# Patient Record
Sex: Female | Born: 1995 | Race: Black or African American | Hispanic: No | Marital: Married | Smoking: Never smoker
Health system: Southern US, Community
[De-identification: ages and names within clinical notes are randomized; demographics above are authoritative.]

## PROBLEM LIST (undated history)

## (undated) ENCOUNTER — Emergency Department (HOSPITAL_COMMUNITY): Admission: EM | Payer: Self-pay | Source: Home / Self Care

## (undated) ENCOUNTER — Inpatient Hospital Stay (HOSPITAL_COMMUNITY): Payer: Self-pay

## (undated) DIAGNOSIS — K219 Gastro-esophageal reflux disease without esophagitis: Secondary | ICD-10-CM

## (undated) DIAGNOSIS — F32A Depression, unspecified: Secondary | ICD-10-CM

## (undated) DIAGNOSIS — H539 Unspecified visual disturbance: Secondary | ICD-10-CM

## (undated) DIAGNOSIS — U071 COVID-19: Secondary | ICD-10-CM

## (undated) DIAGNOSIS — M26609 Unspecified temporomandibular joint disorder, unspecified side: Secondary | ICD-10-CM

## (undated) DIAGNOSIS — E669 Obesity, unspecified: Secondary | ICD-10-CM

## (undated) DIAGNOSIS — O139 Gestational [pregnancy-induced] hypertension without significant proteinuria, unspecified trimester: Secondary | ICD-10-CM

## (undated) DIAGNOSIS — F329 Major depressive disorder, single episode, unspecified: Secondary | ICD-10-CM

## (undated) DIAGNOSIS — F419 Anxiety disorder, unspecified: Secondary | ICD-10-CM

## (undated) DIAGNOSIS — D649 Anemia, unspecified: Secondary | ICD-10-CM

## (undated) DIAGNOSIS — B009 Herpesviral infection, unspecified: Secondary | ICD-10-CM

## (undated) HISTORY — DX: Herpesviral infection, unspecified: B00.9

## (undated) HISTORY — DX: Gestational (pregnancy-induced) hypertension without significant proteinuria, unspecified trimester: O13.9

## (undated) HISTORY — PX: PILONIDAL CYST EXCISION: SHX744

## (undated) HISTORY — DX: Major depressive disorder, single episode, unspecified: F32.9

## (undated) HISTORY — DX: Depression, unspecified: F32.A

## (undated) HISTORY — PX: PILONIDAL CYST / SINUS EXCISION: SUR543

---
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
## 2014-04-10 LAB — OB RESULTS CONSOLE ABO/RH: RH Type: POSITIVE

## 2014-04-10 LAB — OB RESULTS CONSOLE GC/CHLAMYDIA
Chlamydia: NEGATIVE
GC PROBE AMP, GENITAL: NEGATIVE

## 2014-04-10 LAB — OB RESULTS CONSOLE HEPATITIS B SURFACE ANTIGEN: Hepatitis B Surface Ag: NEGATIVE

## 2014-04-10 LAB — OB RESULTS CONSOLE RUBELLA ANTIBODY, IGM: RUBELLA: IMMUNE

## 2014-04-10 LAB — OB RESULTS CONSOLE ANTIBODY SCREEN: ANTIBODY SCREEN: NEGATIVE

## 2014-04-10 LAB — OB RESULTS CONSOLE HIV ANTIBODY (ROUTINE TESTING): HIV: NONREACTIVE

## 2014-04-10 LAB — OB RESULTS CONSOLE RPR: RPR: NONREACTIVE

## 2014-10-03 LAB — OB RESULTS CONSOLE GBS: STREP GROUP B AG: NEGATIVE

## 2014-10-06 ENCOUNTER — Encounter (HOSPITAL_COMMUNITY): Payer: Self-pay | Admitting: Radiology

## 2014-10-06 ENCOUNTER — Inpatient Hospital Stay (HOSPITAL_COMMUNITY): Payer: Medicaid Other

## 2014-10-06 ENCOUNTER — Inpatient Hospital Stay (HOSPITAL_COMMUNITY)
Admission: AD | Admit: 2014-10-06 | Discharge: 2014-10-06 | Disposition: A | Discharge status: Home / Self Care | Payer: Medicaid Other | Source: Ambulatory Visit | Attending: Obstetrics & Gynecology | Admitting: Obstetrics & Gynecology

## 2014-10-06 DIAGNOSIS — O4693 Antepartum hemorrhage, unspecified, third trimester: Secondary | ICD-10-CM

## 2014-10-06 DIAGNOSIS — O368190 Decreased fetal movements, unspecified trimester, not applicable or unspecified: Secondary | ICD-10-CM

## 2014-10-06 DIAGNOSIS — R030 Elevated blood-pressure reading, without diagnosis of hypertension: Secondary | ICD-10-CM | POA: Diagnosis not present

## 2014-10-06 DIAGNOSIS — Z3A37 37 weeks gestation of pregnancy: Secondary | ICD-10-CM | POA: Insufficient documentation

## 2014-10-06 DIAGNOSIS — O26853 Spotting complicating pregnancy, third trimester: Secondary | ICD-10-CM | POA: Diagnosis not present

## 2014-10-06 DIAGNOSIS — O368130 Decreased fetal movements, third trimester, not applicable or unspecified: Secondary | ICD-10-CM | POA: Insufficient documentation

## 2014-10-06 DIAGNOSIS — O359XX0 Maternal care for (suspected) fetal abnormality and damage, unspecified, not applicable or unspecified: Secondary | ICD-10-CM

## 2014-10-06 DIAGNOSIS — IMO0001 Reserved for inherently not codable concepts without codable children: Secondary | ICD-10-CM

## 2014-10-06 LAB — CBC
HCT: 31.5 % — ABNORMAL LOW (ref 36.0–46.0)
Hemoglobin: 10.3 g/dL — ABNORMAL LOW (ref 12.0–15.0)
MCH: 28.2 pg (ref 26.0–34.0)
MCHC: 32.7 g/dL (ref 30.0–36.0)
MCV: 86.3 fL (ref 78.0–100.0)
Platelets: 169 10*3/uL (ref 150–400)
RBC: 3.65 MIL/uL — ABNORMAL LOW (ref 3.87–5.11)
RDW: 13.4 % (ref 11.5–15.5)
WBC: 8.6 10*3/uL (ref 4.0–10.5)

## 2014-10-06 LAB — COMPREHENSIVE METABOLIC PANEL
ALT: 13 U/L (ref 0–35)
AST: 22 U/L (ref 0–37)
Albumin: 2.9 g/dL — ABNORMAL LOW (ref 3.5–5.2)
Alkaline Phosphatase: 136 U/L — ABNORMAL HIGH (ref 39–117)
Anion gap: 12 (ref 5–15)
BUN: 7 mg/dL (ref 6–23)
CO2: 21 mEq/L (ref 19–32)
Calcium: 9.2 mg/dL (ref 8.4–10.5)
Chloride: 102 mEq/L (ref 96–112)
Creatinine, Ser: 0.64 mg/dL (ref 0.50–1.10)
GFR calc Af Amer: >90 mL/min (ref >90)
GFR calc non Af Amer: >90 mL/min (ref >90)
Glucose, Bld: 83 mg/dL (ref 70–99)
Potassium: 3.6 mEq/L — ABNORMAL LOW (ref 3.7–5.3)
Sodium: 135 mEq/L — ABNORMAL LOW (ref 137–147)
Total Bilirubin: 0.5 mg/dL (ref 0.3–1.2)
Total Protein: 7.3 g/dL (ref 6.0–8.3)

## 2014-10-06 LAB — PROTEIN / CREATININE RATIO, URINE
Creatinine, Urine: 80.19 mg/dL
Protein Creatinine Ratio: 0.07 (ref 0.00–0.15)
Total Protein, Urine: 5.4 mg/dL

## 2014-10-06 LAB — URIC ACID: Uric Acid, Serum: 4.4 mg/dL (ref 2.4–7.0)

## 2014-10-06 NOTE — MAU Note (Signed)
Pt. Sent over from doctors office due to increased blood pressures. Here for evaluation. Baby is moving well. Irregular contractions. Did have bright red spotting and was seen at doctor for that today. States she was 1.5cm there. Pt. States she has a headache but has not taken any tylenol for it.

## 2014-10-06 NOTE — Discharge Instructions (Signed)
Maternal fetal medicine is reviewing images from ultrasound - unable to give report tonight.  May need additional images - MFM office will notify [facility] and patient in AM of results.

## 2014-10-06 NOTE — MAU Provider Note (Signed)
  History     CSN: [id number]  Arrival date and time: 10/06/14 1449 No call from nurse on patient  First Provider Initiated Contact with Patient 10/06/14 1720      No chief complaint on file.  HPI  sent from office for elevated BP and vaginal spotting x 24 hours with decreased FM + FM today No headache / epigastric pain  Past Medical History  Diagnosis Date  . Depression   . Anxiety   . Vision abnormalities     Pt wears contacts  . TMJ (temporomandibular joint disorder)   . Anemia   . Obesity   . GERD (gastroesophageal reflux disease)     Past Surgical History  Procedure Laterality Date  . Wisdom tooth extraction  2013    No family history on file.  History  Substance Use Topics  . Smoking status: Never Smoker   . Smokeless tobacco: Not on file  . Alcohol Use: Yes     Comment: Socially/wine or beer maybe once per month    Allergies: No Known Allergies  Prescriptions prior to admission  Medication Sig Dispense Refill  . escitalopram (LEXAPRO) 10 MG tablet Take 1 tablet (10 mg total) by mouth daily after breakfast.  30 tablet  1  . loratadine (CLARITIN) 10 MG tablet Take 1 tablet (10 mg total) by mouth daily.      . medroxyPROGESTERone (DEPO-PROVERA) 150 MG/ML injection Inject 1 mL (150 mg total) into the muscle every 3 (three) months. Next dose due 09/20/2012.  1 mL    . Multiple Vitamin (MULTIVITAMIN WITH MINERALS) TABS Take 1 tablet by mouth daily.       ROS FM today - feels overall decreased for past couple of days Some ctx and cramping Red spotting x 24 hours  Physical Exam   Blood pressure 127/75, pulse 96, temperature 98.4 F (36.9 C), temperature source Oral, resp. rate 16.  Physical Exam Alert and oriented / NAD or pain Abdomen soft and non-tender Uterus gravid and non-tender Defer pelvic - done in office with cervical dilation at 2cm / 70% / vtx 0 station / intact membranes  MAU Course  Procedures  NST - reactive SONO- pending  report  Assessment and Plan  38 weeks with red spotting  In past 24 hours Elevated BP in office today Decreased FMs  1) Serial BP - stable BP 2) PIH - normal today 3) SONO - unable to obtain formal report tonight per sono dept - images sent to MFM for review.                     MFM call to Dr Seymour BarsLavoie - will review images & notify us in AM with results                      may request patient to come to MFM for additional images  DC home - labor precautions to call / additionally PIH signs and any red bleeding like menses OV at [facility] on Friday as scheduled  Marlinda MikeBAILEY, TANYA 10/06/2014, 5:29 PM

## 2014-10-07 ENCOUNTER — Encounter (HOSPITAL_COMMUNITY): Payer: Self-pay

## 2014-10-07 ENCOUNTER — Ambulatory Visit (HOSPITAL_COMMUNITY)
Admission: RE | Admit: 2014-10-07 | Discharge: 2014-10-07 | Disposition: A | Discharge status: Home / Self Care | Payer: Medicaid Other | Source: Ambulatory Visit | Attending: Obstetrics & Gynecology | Admitting: Obstetrics & Gynecology

## 2014-10-07 ENCOUNTER — Other Ambulatory Visit (HOSPITAL_COMMUNITY): Payer: Self-pay | Admitting: Obstetrics and Gynecology

## 2014-10-07 VITALS — BP 120/75 | HR 95 | Wt 244.0 lb

## 2014-10-07 DIAGNOSIS — IMO0001 Reserved for inherently not codable concepts without codable children: Secondary | ICD-10-CM

## 2014-10-07 DIAGNOSIS — O3663X0 Maternal care for excessive fetal growth, third trimester, not applicable or unspecified: Secondary | ICD-10-CM | POA: Diagnosis not present

## 2014-10-07 DIAGNOSIS — O3663X1 Maternal care for excessive fetal growth, third trimester, fetus 1: Secondary | ICD-10-CM

## 2014-10-07 DIAGNOSIS — Z3A37 37 weeks gestation of pregnancy: Secondary | ICD-10-CM | POA: Diagnosis not present

## 2014-10-07 DIAGNOSIS — Q893 Situs inversus: Secondary | ICD-10-CM

## 2014-10-07 DIAGNOSIS — O359XX0 Maternal care for (suspected) fetal abnormality and damage, unspecified, not applicable or unspecified: Secondary | ICD-10-CM

## 2014-10-07 DIAGNOSIS — O358XX0 Maternal care for other (suspected) fetal abnormality and damage, not applicable or unspecified: Secondary | ICD-10-CM | POA: Insufficient documentation

## 2014-10-13 ENCOUNTER — Inpatient Hospital Stay (HOSPITAL_COMMUNITY)
Admission: AD | Admit: 2014-10-13 | Discharge: 2014-10-13 | Disposition: A | Discharge status: Home / Self Care | Payer: Medicaid Other | Source: Ambulatory Visit | Attending: Obstetrics and Gynecology | Admitting: Obstetrics and Gynecology

## 2014-10-13 ENCOUNTER — Encounter (HOSPITAL_COMMUNITY): Payer: Self-pay | Admitting: *Deleted

## 2014-10-13 DIAGNOSIS — O9989 Other specified diseases and conditions complicating pregnancy, childbirth and the puerperium: Secondary | ICD-10-CM | POA: Insufficient documentation

## 2014-10-13 DIAGNOSIS — O133 Gestational [pregnancy-induced] hypertension without significant proteinuria, third trimester: Secondary | ICD-10-CM

## 2014-10-13 DIAGNOSIS — R030 Elevated blood-pressure reading, without diagnosis of hypertension: Secondary | ICD-10-CM | POA: Diagnosis not present

## 2014-10-13 DIAGNOSIS — Z3A38 38 weeks gestation of pregnancy: Secondary | ICD-10-CM | POA: Diagnosis not present

## 2014-10-13 LAB — URINALYSIS, ROUTINE W REFLEX MICROSCOPIC
Bilirubin Urine: NEGATIVE
Glucose, UA: NEGATIVE mg/dL
Ketones, ur: NEGATIVE mg/dL
Nitrite: NEGATIVE
PH: 6.5 (ref 5.0–8.0)
Protein, ur: NEGATIVE mg/dL
SPECIFIC GRAVITY, URINE: 1.01 (ref 1.005–1.030)
Urobilinogen, UA: 1 mg/dL (ref 0.0–1.0)

## 2014-10-13 LAB — CBC
HCT: 30.7 % — ABNORMAL LOW (ref 36.0–46.0)
Hemoglobin: 9.9 g/dL — ABNORMAL LOW (ref 12.0–15.0)
MCH: 28 pg (ref 26.0–34.0)
MCHC: 32.2 g/dL (ref 30.0–36.0)
MCV: 86.7 fL (ref 78.0–100.0)
Platelets: 172 10*3/uL (ref 150–400)
RBC: 3.54 MIL/uL — ABNORMAL LOW (ref 3.87–5.11)
RDW: 13.8 % (ref 11.5–15.5)
WBC: 8.5 10*3/uL (ref 4.0–10.5)

## 2014-10-13 LAB — COMPREHENSIVE METABOLIC PANEL
ALT: 10 U/L (ref 0–35)
AST: 18 U/L (ref 0–37)
Albumin: 2.7 g/dL — ABNORMAL LOW (ref 3.5–5.2)
Alkaline Phosphatase: 140 U/L — ABNORMAL HIGH (ref 39–117)
Anion gap: 12 (ref 5–15)
BUN: 8 mg/dL (ref 6–23)
CO2: 22 mEq/L (ref 19–32)
Calcium: 8.6 mg/dL (ref 8.4–10.5)
Chloride: 101 mEq/L (ref 96–112)
Creatinine, Ser: 0.71 mg/dL (ref 0.50–1.10)
GFR calc Af Amer: >90 mL/min (ref >90)
GFR calc non Af Amer: >90 mL/min (ref >90)
Glucose, Bld: 81 mg/dL (ref 70–99)
Potassium: 3.7 mEq/L (ref 3.7–5.3)
Sodium: 135 mEq/L — ABNORMAL LOW (ref 137–147)
Total Bilirubin: 0.5 mg/dL (ref 0.3–1.2)
Total Protein: 6.7 g/dL (ref 6.0–8.3)

## 2014-10-13 LAB — URIC ACID: Uric Acid, Serum: 4.6 mg/dL (ref 2.4–7.0)

## 2014-10-13 LAB — URINE MICROSCOPIC-ADD ON

## 2014-10-13 LAB — PROTEIN / CREATININE RATIO, URINE
Creatinine, Urine: 103.2 mg/dL
Protein Creatinine Ratio: 0.08 (ref 0.00–0.15)
Total Protein, Urine: 8.3 mg/dL

## 2014-10-13 NOTE — MAU Note (Addendum)
Pt presents to MAU from physicians office for NST, increase in blood pressure, and labs.

## 2014-10-13 NOTE — MAU Provider Note (Signed)
  History     CSN: [id number]  Arrival date and time: 10/13/14 1601     Chief Complaint  Patient presents with  . Non-stress Test  . Hypertension   HPI  [facility]  Past Medical History  Diagnosis Date  . Depression   . Anxiety   . Vision abnormalities     Pt wears contacts  . TMJ (temporomandibular joint disorder)   . Anemia   . Obesity   . GERD (gastroesophageal reflux disease)     Past Surgical History  Procedure Laterality Date  . Wisdom tooth extraction  2013    History reviewed. No pertinent family history.  History  Substance Use Topics  . Smoking status: Never Smoker   . Smokeless tobacco: Not on file  . Alcohol Use: Yes     Comment: Socially/wine or beer maybe once per month    Allergies: No Known Allergies  Prescriptions prior to admission  Medication Sig Dispense Refill Last Dose  . acetaminophen (TYLENOL) 500 MG tablet Take 500 mg by mouth every 6 (six) hours as needed.   Past Month at Unknown time  . diphenhydrAMINE (SOMINEX) 25 MG tablet Take 25-50 mg by mouth at bedtime as needed for itching or sleep.   Past Week at Unknown time  . Prenatal Vit-Fe Fumarate-FA (PRENATAL MULTIVITAMIN) TABS tablet Take 1 tablet by mouth daily at 12 noon.   10/13/2014 at 0900    ROS Physical Exam   Blood pressure 138/86, pulse 100, temperature 98.8 F (37.1 C), resp. rate 18, height 5\' 8"  (1.727 m), weight 112.492 kg (248 lb).  Physical Exam  Alert and oriented Abdomen soft and non-tender Defer VE  NST reactive - no decels / no ctx  MAU Course  Procedures  Assessment and Plan  PIH labs stable - no evidence of PEC NST reactive  Continue expectant management - monitor BP at home and call BP > 160/100 Call with any PIH signs Planned IOL 11/10  Marlinda MikeBAILEY, Ciaran Begay 10/13/2014, 6:19 PM

## 2014-10-13 NOTE — Progress Notes (Signed)
Wiliam Ke Bailey CNM notified of lab results, states she will come and evaluate pt

## 2014-10-15 ENCOUNTER — Telehealth (HOSPITAL_COMMUNITY): Payer: Self-pay | Admitting: *Deleted

## 2014-10-15 ENCOUNTER — Encounter (HOSPITAL_COMMUNITY): Payer: Self-pay | Admitting: *Deleted

## 2014-10-15 NOTE — Telephone Encounter (Signed)
Preadmission screen  

## 2014-10-21 ENCOUNTER — Encounter (HOSPITAL_COMMUNITY): Payer: Self-pay

## 2014-10-21 ENCOUNTER — Inpatient Hospital Stay (HOSPITAL_COMMUNITY)
Admission: RE | Admit: 2014-10-21 | Discharge: 2014-10-24 | DRG: 775 | Disposition: A | Discharge status: Home / Self Care | Payer: Medicaid Other | Source: Ambulatory Visit | Attending: Obstetrics and Gynecology | Admitting: Obstetrics and Gynecology

## 2014-10-21 DIAGNOSIS — O133 Gestational [pregnancy-induced] hypertension without significant proteinuria, third trimester: Principal | ICD-10-CM | POA: Diagnosis present

## 2014-10-21 DIAGNOSIS — O99214 Obesity complicating childbirth: Secondary | ICD-10-CM | POA: Diagnosis present

## 2014-10-21 DIAGNOSIS — O99344 Other mental disorders complicating childbirth: Secondary | ICD-10-CM | POA: Diagnosis present

## 2014-10-21 DIAGNOSIS — D509 Iron deficiency anemia, unspecified: Secondary | ICD-10-CM | POA: Diagnosis present

## 2014-10-21 DIAGNOSIS — O9902 Anemia complicating childbirth: Secondary | ICD-10-CM | POA: Diagnosis present

## 2014-10-21 DIAGNOSIS — O99324 Drug use complicating childbirth: Secondary | ICD-10-CM | POA: Diagnosis present

## 2014-10-21 DIAGNOSIS — F1290 Cannabis use, unspecified, uncomplicated: Secondary | ICD-10-CM | POA: Diagnosis present

## 2014-10-21 DIAGNOSIS — K219 Gastro-esophageal reflux disease without esophagitis: Secondary | ICD-10-CM | POA: Diagnosis present

## 2014-10-21 DIAGNOSIS — Z3A39 39 weeks gestation of pregnancy: Secondary | ICD-10-CM | POA: Diagnosis present

## 2014-10-21 DIAGNOSIS — O139 Gestational [pregnancy-induced] hypertension without significant proteinuria, unspecified trimester: Secondary | ICD-10-CM | POA: Diagnosis present

## 2014-10-21 DIAGNOSIS — F329 Major depressive disorder, single episode, unspecified: Secondary | ICD-10-CM | POA: Diagnosis present

## 2014-10-21 DIAGNOSIS — F419 Anxiety disorder, unspecified: Secondary | ICD-10-CM | POA: Diagnosis present

## 2014-10-21 DIAGNOSIS — D62 Acute posthemorrhagic anemia: Secondary | ICD-10-CM | POA: Diagnosis not present

## 2014-10-21 DIAGNOSIS — E669 Obesity, unspecified: Secondary | ICD-10-CM | POA: Diagnosis present

## 2014-10-21 DIAGNOSIS — O9962 Diseases of the digestive system complicating childbirth: Secondary | ICD-10-CM | POA: Diagnosis present

## 2014-10-21 LAB — COMPREHENSIVE METABOLIC PANEL
ALT: 12 U/L (ref 0–35)
AST: 19 U/L (ref 0–37)
Albumin: 2.7 g/dL — ABNORMAL LOW (ref 3.5–5.2)
Alkaline Phosphatase: 155 U/L — ABNORMAL HIGH (ref 39–117)
Anion gap: 15 (ref 5–15)
BUN: 7 mg/dL (ref 6–23)
CALCIUM: 8.5 mg/dL (ref 8.4–10.5)
CO2: 19 mEq/L (ref 19–32)
Chloride: 102 mEq/L (ref 96–112)
Creatinine, Ser: 0.59 mg/dL (ref 0.50–1.10)
GFR calc Af Amer: >90 mL/min (ref >90)
GFR calc non Af Amer: >90 mL/min (ref >90)
Glucose, Bld: 113 mg/dL — ABNORMAL HIGH (ref 70–99)
Potassium: 3.4 mEq/L — ABNORMAL LOW (ref 3.7–5.3)
Sodium: 136 mEq/L — ABNORMAL LOW (ref 137–147)
TOTAL PROTEIN: 6.3 g/dL (ref 6.0–8.3)
Total Bilirubin: 0.4 mg/dL (ref 0.3–1.2)

## 2014-10-21 LAB — CBC
HCT: 31.4 % — ABNORMAL LOW (ref 36.0–46.0)
Hemoglobin: 10 g/dL — ABNORMAL LOW (ref 12.0–15.0)
MCH: 27.7 pg (ref 26.0–34.0)
MCHC: 31.8 g/dL (ref 30.0–36.0)
MCV: 87 fL (ref 78.0–100.0)
Platelets: 166 10*3/uL (ref 150–400)
RBC: 3.61 MIL/uL — ABNORMAL LOW (ref 3.87–5.11)
RDW: 14.3 % (ref 11.5–15.5)
WBC: 10 10*3/uL (ref 4.0–10.5)

## 2014-10-21 LAB — URIC ACID: Uric Acid, Serum: 4.4 mg/dL (ref 2.4–7.0)

## 2014-10-21 MED ORDER — ONDANSETRON HCL 4 MG/2ML IJ SOLN: 4.0000 mg | Freq: Four times a day (QID) | INTRAMUSCULAR | Status: DC | PRN

## 2014-10-21 MED ORDER — OXYTOCIN 40 UNITS IN LACTATED RINGERS INFUSION - SIMPLE MED
62.5000 mL/h | INTRAVENOUS | Status: DC
  Filled 2014-10-21: qty 1000

## 2014-10-21 MED ORDER — OXYTOCIN 10 UNIT/ML IJ SOLN: 10.0000 [IU] | Freq: Once | INTRAMUSCULAR | Status: DC

## 2014-10-21 MED ORDER — OXYTOCIN BOLUS FROM INFUSION
500.0000 mL | INTRAVENOUS | Status: DC
  Administered 2014-10-22: 500 mL via INTRAVENOUS

## 2014-10-21 MED ORDER — MISOPROSTOL 25 MCG QUARTER TABLET
25.0000 ug | ORAL_TABLET | ORAL | Status: DC | PRN
  Administered 2014-10-21 – 2014-10-22 (×3): 25 ug via VAGINAL
  Filled 2014-10-21 (×3): qty 0.25

## 2014-10-21 MED ORDER — OXYCODONE-ACETAMINOPHEN 5-325 MG PO TABS: 1.0000 | ORAL_TABLET | ORAL | Status: DC | PRN

## 2014-10-21 MED ORDER — ZOLPIDEM TARTRATE 5 MG PO TABS
5.0000 mg | ORAL_TABLET | Freq: Every day | ORAL | Status: DC
  Administered 2014-10-22: 5 mg via ORAL
  Filled 2014-10-21: qty 1

## 2014-10-21 MED ORDER — CITRIC ACID-SODIUM CITRATE 334-500 MG/5ML PO SOLN: 30.0000 mL | ORAL | Status: DC | PRN

## 2014-10-21 MED ORDER — LACTATED RINGERS IV SOLN
500.0000 mL | INTRAVENOUS | Status: DC | PRN
  Administered 2014-10-21 – 2014-10-22 (×2): 1000 mL via INTRAVENOUS

## 2014-10-21 MED ORDER — OXYCODONE-ACETAMINOPHEN 5-325 MG PO TABS: 2.0000 | ORAL_TABLET | ORAL | Status: DC | PRN

## 2014-10-21 MED ORDER — LIDOCAINE HCL (PF) 1 % IJ SOLN
30.0000 mL | INTRAMUSCULAR | Status: DC | PRN
  Filled 2014-10-21: qty 30

## 2014-10-21 MED ORDER — TERBUTALINE SULFATE 1 MG/ML IJ SOLN: 0.2500 mg | Freq: Once | INTRAMUSCULAR | Status: AC | PRN

## 2014-10-21 NOTE — H&P (Signed)
  OB ADMISSION/ HISTORY & PHYSICAL:  Admission Date: 10/21/2014  6:40 PM  Admit Diagnosis: 39.[redacted] weeks gestation, Gestational HTN  Stacie Simpson is a 18 y.o. female presenting for IOL for Gestational HTN.  Prenatal History: G1P0   EDC:10/26/2014, by Ultrasound   [facility]  Primary Ob Provider: Marlinda Mikeanya Bailey, CNM Prenatal course complicated by gestational HTN-neg PIH labs 1 wk ago, normal fetal surveillance; rt fetal pyelectasis-resolved by 37 wks; obesity; IDA  Prenatal Labs: ABO, Rh: O (04/30 0000)  Antibody: Negative (04/30 0000) Rubella: Immune (04/30 0000)  RPR: Nonreactive (04/30 0000)  HBsAg: Negative (04/30 0000)  HIV: Non-reactive (04/30 0000)  GBS: Negative (10/23 0000)  1 hr GTT : 105  Medical / Surgical History :  Past medical history:  Past Medical History  Diagnosis Date  . Depression   . Anxiety   . Vision abnormalities     Pt wears contacts  . TMJ (temporomandibular joint disorder)   . Anemia   . Obesity   . GERD (gastroesophageal reflux disease)   . Pregnancy induced hypertension      Past surgical history:  Past Surgical History  Procedure Laterality Date  . Wisdom tooth extraction  2013    Family History:  Family History  Problem Relation Age of Onset  . Hypertension Father   . Hypertension Maternal Grandmother   . Hypertension Maternal Grandfather   . Diabetes Maternal Grandfather   . Heart disease Maternal Grandfather   . Hypertension Paternal Grandmother   . Hypertension Paternal Grandfather   . Cancer Paternal Grandfather     tongue  . Heart disease Paternal Grandfather      Social History:  reports that she has never smoked. She does not have any smokeless tobacco history on file. She reports that she drinks alcohol. She reports that she uses illicit drugs (Marijuana).  Allergies: Review of patient's allergies indicates no known allergies.   Current Medications at time of admission:  Prior  to Admission medications   Medication Sig Start Date End Date Taking? Authorizing Provider  diphenhydrAMINE (SOMINEX) 25 MG tablet Take 25-50 mg by mouth at bedtime as needed for itching or sleep.   Yes Historical Provider, MD  Prenatal Vit-Fe Fumarate-FA (PRENATAL MULTIVITAMIN) TABS tablet Take 1 tablet by mouth daily at 12 noon.   Yes Historical Provider, MD  acetaminophen (TYLENOL) 500 MG tablet Take 500 mg by mouth every 6 (six) hours as needed for moderate pain or headache.     Historical Provider, MD     Review of Systems: Good FM No ctx No LOF No VB No HA, visual disturbance, epigastric pain or edema  Physical Exam:  VS: 136/91, 86, 18, 98.6  General: alert and oriented, appears comfortable Heart: RRR Lungs: Clear lung fields Abdomen: Gravid, soft and non-tender, non-distended / uterus: gravid, non-tender Extremities: No edema Genitalia / VE: 2/70/-2, vtx FHR: baseline rate 145 / variability mod / accelerations + / no decelerations TOCO: irregular  Assessment: 39.[redacted] weeks gestation Gestational HTN Labor stage: induction FHR category I  Plan:  Admit, PIH labs, cervical ripening with Cytotec then Pitocin induction, anticipate SVD. Dr. Cherly Hensenousins notified of admission / plan of care   Lawernce PittsBHAMBRI, Joshual Terrio, N MSN, CNM 10/21/2014, 7:51 PM

## 2014-10-21 NOTE — Plan of Care (Signed)
Problem: Consults Goal: Birthing Suites Patient Information Press F2 to bring up selections list Outcome: Completed/Met Date Met:  10/21/14  Pt 37-[redacted] weeks EGA Induction

## 2014-10-22 ENCOUNTER — Inpatient Hospital Stay (HOSPITAL_COMMUNITY): Payer: Medicaid Other | Admitting: Anesthesiology

## 2014-10-22 ENCOUNTER — Encounter (HOSPITAL_COMMUNITY): Payer: Self-pay

## 2014-10-22 LAB — CBC
HCT: 32.8 % — ABNORMAL LOW (ref 36.0–46.0)
HEMATOCRIT: 29.9 % — AB (ref 36.0–46.0)
Hemoglobin: 10.4 g/dL — ABNORMAL LOW (ref 12.0–15.0)
Hemoglobin: 9.7 g/dL — ABNORMAL LOW (ref 12.0–15.0)
MCH: 27.4 pg (ref 26.0–34.0)
MCH: 28 pg (ref 26.0–34.0)
MCHC: 31.7 g/dL (ref 30.0–36.0)
MCHC: 32.4 g/dL (ref 30.0–36.0)
MCV: 86.5 fL (ref 78.0–100.0)
MCV: 86.4 fL (ref 78.0–100.0)
PLATELETS: 161 10*3/uL (ref 150–400)
Platelets: 159 10*3/uL (ref 150–400)
RBC: 3.79 MIL/uL — AB (ref 3.87–5.11)
RBC: 3.46 MIL/uL — ABNORMAL LOW (ref 3.87–5.11)
RDW: 14.4 % (ref 11.5–15.5)
RDW: 14.3 % (ref 11.5–15.5)
WBC: 12.9 10*3/uL — ABNORMAL HIGH (ref 4.0–10.5)
WBC: 17.5 10*3/uL — ABNORMAL HIGH (ref 4.0–10.5)

## 2014-10-22 LAB — TYPE AND SCREEN
ABO/RH(D): O POS
Antibody Screen: NEGATIVE

## 2014-10-22 LAB — RPR

## 2014-10-22 LAB — ABO/RH: ABO/RH(D): O POS

## 2014-10-22 MED ORDER — DIPHENHYDRAMINE HCL 25 MG PO CAPS: 25.0000 mg | ORAL_CAPSULE | Freq: Four times a day (QID) | ORAL | Status: DC | PRN

## 2014-10-22 MED ORDER — PHENYLEPHRINE 40 MCG/ML (10ML) SYRINGE FOR IV PUSH (FOR BLOOD PRESSURE SUPPORT)
80.0000 ug | PREFILLED_SYRINGE | INTRAVENOUS | Status: DC | PRN
  Filled 2014-10-22: qty 10

## 2014-10-22 MED ORDER — LANOLIN HYDROUS EX OINT: TOPICAL_OINTMENT | CUTANEOUS | Status: DC | PRN

## 2014-10-22 MED ORDER — BUTORPHANOL TARTRATE 1 MG/ML IJ SOLN
1.0000 mg | Freq: Once | INTRAMUSCULAR | Status: AC
  Administered 2014-10-22: 1 mg via INTRAVENOUS
  Filled 2014-10-22: qty 1

## 2014-10-22 MED ORDER — SIMETHICONE 80 MG PO CHEW: 80.0000 mg | CHEWABLE_TABLET | ORAL | Status: DC | PRN

## 2014-10-22 MED ORDER — EPHEDRINE 5 MG/ML INJ: 10.0000 mg | INTRAVENOUS | Status: DC | PRN

## 2014-10-22 MED ORDER — PHENYLEPHRINE 40 MCG/ML (10ML) SYRINGE FOR IV PUSH (FOR BLOOD PRESSURE SUPPORT): 80.0000 ug | PREFILLED_SYRINGE | INTRAVENOUS | Status: DC | PRN

## 2014-10-22 MED ORDER — BENZOCAINE-MENTHOL 20-0.5 % EX AERO
1.0000 "application " | INHALATION_SPRAY | CUTANEOUS | Status: DC | PRN
  Filled 2014-10-22: qty 56

## 2014-10-22 MED ORDER — ONDANSETRON HCL 4 MG PO TABS: 4.0000 mg | ORAL_TABLET | ORAL | Status: DC | PRN

## 2014-10-22 MED ORDER — OXYCODONE-ACETAMINOPHEN 5-325 MG PO TABS: 2.0000 | ORAL_TABLET | ORAL | Status: DC | PRN

## 2014-10-22 MED ORDER — ONDANSETRON HCL 4 MG/2ML IJ SOLN: 4.0000 mg | INTRAMUSCULAR | Status: DC | PRN

## 2014-10-22 MED ORDER — DIBUCAINE 1 % RE OINT: 1.0000 "application " | TOPICAL_OINTMENT | RECTAL | Status: DC | PRN

## 2014-10-22 MED ORDER — IBUPROFEN 600 MG PO TABS
600.0000 mg | ORAL_TABLET | Freq: Four times a day (QID) | ORAL | Status: DC
  Administered 2014-10-22 – 2014-10-24 (×6): 600 mg via ORAL
  Filled 2014-10-22 (×6): qty 1

## 2014-10-22 MED ORDER — SENNOSIDES-DOCUSATE SODIUM 8.6-50 MG PO TABS
2.0000 | ORAL_TABLET | ORAL | Status: DC
  Administered 2014-10-22 – 2014-10-23 (×2): 2 via ORAL
  Filled 2014-10-22 (×2): qty 2

## 2014-10-22 MED ORDER — LIDOCAINE HCL (PF) 1 % IJ SOLN
INTRAMUSCULAR | Status: DC | PRN
  Administered 2014-10-22 (×4): 4 mL

## 2014-10-22 MED ORDER — OXYCODONE-ACETAMINOPHEN 5-325 MG PO TABS: 1.0000 | ORAL_TABLET | ORAL | Status: DC | PRN

## 2014-10-22 MED ORDER — DIPHENHYDRAMINE HCL 50 MG/ML IJ SOLN: 12.5000 mg | INTRAMUSCULAR | Status: DC | PRN

## 2014-10-22 MED ORDER — LACTATED RINGERS IV SOLN: 500.0000 mL | Freq: Once | INTRAVENOUS | Status: DC

## 2014-10-22 MED ORDER — OXYTOCIN 40 UNITS IN LACTATED RINGERS INFUSION - SIMPLE MED
1.0000 m[IU]/min | INTRAVENOUS | Status: DC
  Administered 2014-10-22: 2 m[IU]/min via INTRAVENOUS

## 2014-10-22 MED ORDER — TERBUTALINE SULFATE 1 MG/ML IJ SOLN: 0.2500 mg | Freq: Once | INTRAMUSCULAR | Status: DC | PRN

## 2014-10-22 MED ORDER — FENTANYL 2.5 MCG/ML BUPIVACAINE 1/10 % EPIDURAL INFUSION (WH - ANES)
14.0000 mL/h | INTRAMUSCULAR | Status: DC | PRN
  Administered 2014-10-22: 14 mL/h via EPIDURAL
  Filled 2014-10-22: qty 125

## 2014-10-22 MED ORDER — WITCH HAZEL-GLYCERIN EX PADS: 1.0000 "application " | MEDICATED_PAD | CUTANEOUS | Status: DC | PRN

## 2014-10-22 NOTE — Progress Notes (Signed)
S:  aware of ctx but not painful  O:  VS: Blood pressure 124/74, pulse 104, temperature 98.7 F (37.1 C), temperature source Oral, resp. rate 20, height 5\' 10"  (1.778 m), weight 111.585 kg (246 lb).        FHR : baseline 150 / variability moderate / accelerations + / no decelerations        Toco: contractions every 1-2 minutes / mild to moderate  (too many ctx for pitocin)        Cervix : 2cm / 70% / vtx / 0 station / posterior cervix   (no cervical change despite 3 doses cytotec - frequent ctx)        Membranes: intact        Cervical balloon placed without difficulty - inflated with 60 ml - traction applied     A: latent labor     FHR category 1  P: traction to cervical balloon Q hour      call if analgesia needed - ambulate and sit on ball ad lib - planning epidural in active labor       AROM when balloon out - pitocin as indicated with IUPC with persistent short interval   Marlinda MikeBAILEY, Desree Leap CNM, MSN, [facility] Center[facility]ACNM 10/22/2014, 0930am

## 2014-10-22 NOTE — Progress Notes (Signed)
Care transferred from Scheryl Martenhristine Soliz, RN to Woody Sellerhelsea Daric Koren, RN

## 2014-10-22 NOTE — Progress Notes (Signed)
Delivery Note  First Stage: Labor onset: 10/22/14 @ 1300  Induction: Cytotec and Foley bulb Augmentation : Pitocin Analgesia /Anesthesia intrapartum: Epidural AROM at 1204  Second Stage: Complete dilation at 1844 Onset of pushing at 1850 FHR second stage: 140 bmp, early decelerations with contractions    Delivery of a viable female at [id number] by SNM/CNM in ROA position No nuchal cord Cord double clamped after cessation of pulsation, cut by FOB Cord blood sample collected   Third Stage: [facility] @ 1914 Placenta disposition: to hospital disposal  Uterine tone firm with massage / bleeding moderate, 1 small clot   Perineum and vagina intact Superficial wide abrasions that were unresponsive to pressure for hemostasis  Anesthesia for repair: Epidural Repair: 4.0 vicryl single suture supraclitoral, single suture inside left labia, single suture inside right labia outside of the hymenal band  Est. Blood Loss (mL): 350  Complications: none  Mom to postpartum.  Baby to Couplet care / Skin to Skin.  Newborn: Birth Weight: 8#7.1oz Apgar Scores: 9, 9 Feeding planned: Breast  Milinda CaveMeredith Rollan Roger, SNM

## 2014-10-22 NOTE — Anesthesia Procedure Notes (Signed)
Epidural Patient location during procedure: OB Start time: 10/22/2014 2:14 PM  Staffing Anesthesiologist: Zeah Germano Performed by: anesthesiologist   Preanesthetic Checklist Completed: patient identified, site marked, surgical consent, pre-op evaluation, timeout performed, IV checked, risks and benefits discussed and monitors and equipment checked  Epidural Patient position: sitting Prep: site prepped and draped and DuraPrep Patient monitoring: continuous pulse ox and blood pressure Approach: midline Location: L3-L4 Injection technique: LOR air  Needle:  Needle type: Tuohy  Needle gauge: 17 G Needle length: 9 cm and 9 Needle insertion depth: 8 cm Catheter type: closed end flexible Catheter size: 19 Gauge Catheter at skin depth: 13 cm Test dose: negative  Assessment Events: blood not aspirated, injection not painful, no injection resistance, negative IV test and no paresthesia  Additional Notes Discussed risk of headache, infection, bleeding, nerve injury and failed or incomplete block.  Patient voices understanding and wishes to proceed.  Epidural placed easily on first attempt.  No paresthesia.  Patient tolerated procedure well with no apparent complications.  Jasmine DecemberA. Maxx Pham, MDReason for block:procedure for pain

## 2014-10-22 NOTE — Progress Notes (Signed)
S:  Pressure with ctx  O:  VS: Blood pressure 114/73, pulse 108, temperature 99.2 F (37.3 C), temperature source Oral, resp. rate 20, height 5\' 10"  (1.778 m), weight 111.585 kg (246 lb).        FHR : baseline 140 / variability moderate / accelerations + / no decelerations        Toco: contractions every 2-3 minutes / some couplets / MVU 180 - pitocin 6 mu/min        Cervix : 9cm / 100% / vtx  0 with caput / persistent OP with developing caput        Membranes: clear - small show        Foley draining clear yellow urine  A: active labor     FHR category 1  P: reposition opposite lateral with peanut      increase pitocin to maintain adequate ctx intensity      IVF bolus 300ml - temp 99.2   Marlinda MikeBAILEY, Conan Mcmanaway CNM, MSN, FACNM 10/22/2014, 5:50 PM

## 2014-10-22 NOTE — Anesthesia Preprocedure Evaluation (Signed)
Anesthesia Evaluation  Patient identified by MRN, date of birth, ID band Patient awake    Reviewed: Allergy & Precautions, H&P , NPO status , Patient's Chart, lab work & pertinent test results, reviewed documented beta blocker date and time   History of Anesthesia Complications Negative for: history of anesthetic complications  Airway Mallampati: III  TM Distance: >3 FB Neck ROM: full    Dental  (+) Teeth Intact   Pulmonary neg pulmonary ROS,  breath sounds clear to auscultation        Cardiovascular hypertension (PIH), Rhythm:regular Rate:Normal     Neuro/Psych Anxiety Depression negative neurological ROS     GI/Hepatic negative GI ROS, Neg liver ROS, GERD-  Medicated,  Endo/Other  Morbid obesity  Renal/GU negative Renal ROS     Musculoskeletal   Abdominal   Peds  Hematology  (+) anemia ,   Anesthesia Other Findings   Reproductive/Obstetrics (+) Pregnancy                             Anesthesia Physical Anesthesia Plan  ASA: III  Anesthesia Plan: Epidural   Post-op Pain Management:    Induction:   Airway Management Planned:   Additional Equipment:   Intra-op Plan:   Post-operative Plan:   Informed Consent: I have reviewed the patients History and Physical, chart, labs and discussed the procedure including the risks, benefits and alternatives for the proposed anesthesia with the patient or authorized representative who has indicated his/her understanding and acceptance.     Plan Discussed with:   Anesthesia Plan Comments:         Anesthesia Quick Evaluation

## 2014-10-22 NOTE — Progress Notes (Signed)
S: Epidural placed, and feeling pain relief  O:  VS: Blood pressure 109/55, pulse 89, temperature 98.7 F (37.1 C), temperature source Oral, resp. rate 20, height 5\' 10"  (1.778 m), weight 111.585 kg (246 lb).        FHR : baseline 150 / variability moderate 6-25bmp / accelerations 15x15 / no decelerations        Toco: contractions every 2-3 minutes / 60-80        Cervix : 7cm/90%/-1 vtx, feels OP        Membranes: AROM  A: Active labor     FHR category 1  P: Foley Catheter placed      Exaggerated Sims position with peanut     Consider Pitocin augmentation       Re-evaluate in 1-2 hours  Milinda CaveMeredith Kiffany Schelling, SNM

## 2014-10-22 NOTE — Progress Notes (Signed)
S:  Reports pain relief from Stadol and resting well       Doing well, has used birthing ball intermittently   O:  VS: Blood pressure 124/74, pulse 104, temperature 98.7 F (37.1 C), temperature source Oral, resp. rate 20, height 5\' 10"  (1.778 m), weight 111.585 kg (246 lb).        FHR : baseline 140 / variability Moderate / accelerations 15x15 / no decelerations        Toco: contractions every 1-4 minutes / 60-90 sec        Foley Bulb out        Cervix : 5cm/80%/-1/vtx. Bloody show present        Membranes: AROM, clear fluid          A: Active labor     FHR category 1     AROM  P: Encouraged to ambulate, sit on birthing ball, shower      Discussed pain management options - tolerated 1 dose of Stadol well      Re-evaluate in 1-2 hours  Milinda CaveMeredith Larrie Fraizer, SNM

## 2014-10-22 NOTE — Progress Notes (Signed)
S:  Comfortable and has been resting with epidural       Feeling intermittent pressure in bottom like she has to use the bathroom  O:  VS: Blood pressure 115/66, pulse 93, temperature 98.7 F (37.1 C), temperature source Oral, resp. rate 20, height 5\' 10"  (1.778 m), weight 111.585 kg (246 lb).        FHR : baseline 140 / variability moderate 6-25bmp / accelerations-none / no decelerations        Toco: contractions every 1-184minutes / 50-70sec         Cervix : 7/90/-1, vtx. Feels asynclitic         Membranes: AROM  A: Active labor     FHR category 1  P:  No progression in 2 hours - unable to monitor contractions well in exaggerated sims positions      Placed IUPC to monitor contractions      Begin Pitocin [id number]      Continue exaggerated sims positions   Milinda CaveMeredith Bona Hubbard, SNM

## 2014-10-22 NOTE — Progress Notes (Signed)
Subjective:   Comfortable, feels well rested after taking sleeping aide.  Objective:   VS: Blood pressure 116/53, pulse 106, temperature 98.7 F (37.1 C), temperature source Oral, resp. rate 18, height 5\' 10"  (1.778 m), weight 111.585 kg (246 lb). FHR: baseline 135 / variability nod / accelerations + / no decelerations Toco: irregular Cervix: Dilation: 2.5 Effacement (%): 70 Station: -2 Exam by:: Veronica Mensah Membranes: intact  Assessment:  Labor: induction FHR category I Gestational HTN  Plan:  PIH labs normal, normotensive, no evidence of PEC. Cervical ripening with Cytotec x3 doses. Start Pitocin induction 4 hrs after last Cytotec dose. Anticipate SVD.     Stacie LarryBHAMBRI, Stacie Simpson, N MSN, CNM 10/22/2014, 6:25 AM

## 2014-10-23 LAB — CBC
HCT: 27.2 % — ABNORMAL LOW (ref 36.0–46.0)
Hemoglobin: 8.8 g/dL — ABNORMAL LOW (ref 12.0–15.0)
MCH: 27.9 pg (ref 26.0–34.0)
MCHC: 32.4 g/dL (ref 30.0–36.0)
MCV: 86.3 fL (ref 78.0–100.0)
Platelets: 143 10*3/uL — ABNORMAL LOW (ref 150–400)
RBC: 3.15 MIL/uL — ABNORMAL LOW (ref 3.87–5.11)
RDW: 14.4 % (ref 11.5–15.5)
WBC: 15.7 10*3/uL — ABNORMAL HIGH (ref 4.0–10.5)

## 2014-10-23 NOTE — Plan of Care (Signed)
Problem: Phase I Progression Outcomes Goal: Pain controlled with appropriate interventions Outcome: Completed/Met Date Met:  10/23/14 Goal: Voiding adequately Outcome: Completed/Met Date Met:  10/23/14 Goal: OOB as tolerated unless otherwise ordered Outcome: Completed/Met Date Met:  10/23/14 Goal: VS, stable, temp < 100.4 degrees F Outcome: Completed/Met Date Met:  10/23/14 Goal: Initial discharge plan identified Outcome: Completed/Met Date Met:  10/23/14 Goal: Other Phase I Outcomes/Goals Outcome: Completed/Met Date Met:  10/23/14  Problem: Phase II Progression Outcomes Goal: Pain controlled on oral analgesia Outcome: Completed/Met Date Met:  10/23/14 Goal: Progress activity as tolerated unless otherwise ordered Outcome: Completed/Met Date Met:  10/23/14 Goal: Afebrile, VS remain stable Outcome: Completed/Met Date Met:  10/23/14 Goal: Tolerating diet Outcome: Completed/Met Date Met:  10/23/14 Goal: Other Phase II Outcomes/Goals Outcome: Completed/Met Date Met:  10/23/14

## 2014-10-23 NOTE — Plan of Care (Signed)
Problem: Discharge Progression Outcomes Goal: Activity appropriate for discharge plan Outcome: Completed/Met Date Met:  10/23/14 Goal: Tolerating diet Outcome: Completed/Met Date Met:  10/23/14 Goal: Pain controlled with appropriate interventions Outcome: Completed/Met Date Met:  10/23/14

## 2014-10-23 NOTE — Progress Notes (Addendum)
CSW acknowledges consult for maternal history of depression and anxiety.   CSW attempted to meet with the MOB, but she had numerous visitors present.  CSW will make re-attempt prior to discharge.   Update: CSW made second attempt to meet with the MOB.  MOB continued to have visitors and was resting upon arrival.  CSW agreed to return in the morning on 11/13 prior to visitors arrival.  MOB agreeable.

## 2014-10-23 NOTE — Lactation Note (Signed)
This note was copied from the chart of Stacie Simpson. Lactation Consultation Note New BF mom has bouncy areolas w/very compressible nipples. Baby was getting bath, mom stated BF going well now, had difficulty at first getting latched, but is doing good now. Denies painful BF and latching now. Discussed importance of a deep latch. Hand expression taught w/noted colostrum. Mom encouraged to feed baby 8-12 times/24 hours and with feeding cues. Mom encouraged to waken baby for feeds. Educated about newborn behavior. Referred to Baby and Me Book in Breastfeeding section Pg. 22-23 for position options and Proper latch demonstration. Mom encouraged to do skin-to-skin. WH/LC brochure given w/resources, support groups and LC services. Patient Name: Stacie Simpson ZOXWR'UToday's Date: 10/23/2014 Reason for consult: Initial assessment   Maternal Data Has patient been taught Hand Expression?: Yes Does the patient have breastfeeding experience prior to this delivery?: No  Feeding Feeding Type: Breast Fed Length of feed: 25 min  LATCH Score/Interventions Latch: Repeated attempts needed to sustain latch, nipple held in mouth throughout feeding, stimulation needed to elicit sucking reflex. Intervention(s): Adjust position;Assist with latch;Breast compression  Audible Swallowing: A few with stimulation Intervention(s): Skin to skin;Hand expression  Type of Nipple: Flat Intervention(s): Hand pump  Comfort (Breast/Nipple): Soft / non-tender     Hold (Positioning): Assistance needed to correctly position infant at breast and maintain latch. Intervention(s): Breastfeeding basics reviewed;Support Pillows;Position options;Skin to skin  LATCH Score: 6  Lactation Tools Discussed/Used     Consult Status Consult Status: Follow-up Date: 10/23/14 Follow-up type: In-patient    Shandrea Lusk, Diamond NickelLAURA G 10/23/2014, 1:44 AM

## 2014-10-23 NOTE — Progress Notes (Signed)
Patient ID: Stacie Simpson, female   DOB: [birth year], 18 y.o.   MRN: [id number] INTERVAL NOTE:  S:   Sitting in bed, min cramping, (+) voids, small bleed, denies HA/NV/dizziness  O:   VSS, AAO x 3, NAD  1 FB below U  Scant lochia  A / P:   PPD #0  Stable post partum  Routine PP orders  Considering early d/c home tomorrow  [name], [name], M, MSN, CNM 10/23/2014, 11:40 AM

## 2014-10-23 NOTE — Anesthesia Postprocedure Evaluation (Signed)
Anesthesia Post Note  Patient: Stacie Simpson  Procedure(s) Performed: * No procedures listed *  Anesthesia type: Epidural  Patient location: Mother/Baby  Post pain: Pain level controlled  Post assessment: Post-op Vital signs reviewed  Last Vitals:  Filed Vitals:   10/23/14 0620  BP: 117/66  Pulse: 113  Temp: 36.7 C  Resp:     Post vital signs: Reviewed  Level of consciousness:alert  Complications: No apparent anesthesia complications

## 2014-10-23 NOTE — Progress Notes (Signed)
UR chart review completed.  

## 2014-10-24 DIAGNOSIS — D509 Iron deficiency anemia, unspecified: Secondary | ICD-10-CM | POA: Diagnosis present

## 2014-10-24 DIAGNOSIS — D62 Acute posthemorrhagic anemia: Secondary | ICD-10-CM | POA: Diagnosis not present

## 2014-10-24 MED ORDER — MAGNESIUM OXIDE 400 (241.3 MG) MG PO TABS
200.0000 mg | ORAL_TABLET | Freq: Every day | ORAL | Status: DC
  Administered 2014-10-24: 200 mg via ORAL
  Filled 2014-10-24: qty 0.5

## 2014-10-24 MED ORDER — OXYCODONE-ACETAMINOPHEN 5-325 MG PO TABS: 1.0000 | ORAL_TABLET | ORAL | Status: DC | PRN

## 2014-10-24 MED ORDER — IBUPROFEN 600 MG PO TABS: 600.0000 mg | ORAL_TABLET | Freq: Four times a day (QID) | ORAL | Status: DC

## 2014-10-24 MED ORDER — MAGNESIUM OXIDE 400 (241.3 MG) MG PO TABS: 200.0000 mg | ORAL_TABLET | Freq: Every day | ORAL | Status: DC

## 2014-10-24 MED ORDER — POLYSACCHARIDE IRON COMPLEX 150 MG PO CAPS: 150.0000 mg | ORAL_CAPSULE | Freq: Every day | ORAL | Status: DC

## 2014-10-24 NOTE — Plan of Care (Signed)
Problem: Discharge Progression Outcomes Goal: Afebrile, VS remain stable at discharge Outcome: Completed/Met Date Met:  10/24/14

## 2014-10-24 NOTE — Lactation Note (Signed)
This note was copied from the chart of Stacie Simpson. Lactation Consultation Note  Mom states she has been giving bottles because latch is shallow and painful.  Reviewed supply and demand.  Stressed importance of pumping every 3 hours if baby doesn't latch or feed well.  Mom requesting assist prior to discharge.  Baby was just given formula so not showing feeding cues.  Waking techniques done and baby placed in football hold on right breast.  Parents shown correct techniques to use for positioning and latch.  Baby latched well after a few attempts and once bottom lip pulled out mom was comfortable.  Demonstrated the use of a nipple shield to use as a backup if needed at home.  Baby was able to obtain good latch but then fell asleep.  A lot of teaching done.  Lactation outpatient appointment made for 10/29/14.  Encouraged to call with concerns prn.  Patient Name: Stacie Simpson IONGE'XToday's Date: 10/24/2014 Reason for consult: Follow-up assessment;Breast/nipple pain;Difficult latch   Maternal Data    Feeding Feeding Type: Breast Fed Length of feed: 7 min  LATCH Score/Interventions Latch: Grasps breast easily, tongue down, lips flanged, rhythmical sucking. Intervention(s): Adjust position;Assist with latch;Breast massage;Breast compression  Audible Swallowing: A few with stimulation Intervention(s): Hand expression;Alternate breast massage  Type of Nipple: Everted at rest and after stimulation  Comfort (Breast/Nipple): Filling, red/small blisters or bruises, mild/mod discomfort  Problem noted: Mild/Moderate discomfort Interventions (Mild/moderate discomfort): Comfort gels;Pre-pump if needed  Hold (Positioning): Assistance needed to correctly position infant at breast and maintain latch. Intervention(s): Breastfeeding basics reviewed;Support Pillows;Position options  LATCH Score: 7  Lactation Tools Discussed/Used     Consult Status Date: 10/29/14 Follow-up type:  Out-patient    Huston FoleyMOULDEN, Vallarie Fei S 10/24/2014, 12:07 PM

## 2014-10-24 NOTE — Plan of Care (Signed)
Problem: Discharge Progression Outcomes Goal: Complications resolved/controlled Outcome: Completed/Met Date Met:  10/24/14     

## 2014-10-24 NOTE — Discharge Summary (Signed)
Obstetric Discharge Summary  Reason for Admission: induction of labor - [redacted] weeks gestational hypertension Prenatal Procedures: NST, ultrasound and serial PIH labs Intrapartum Procedures: epidural anesthesia / spontaneous vaginal delivery Postpartum Procedures: none Complications-Operative and Postpartum: none HEMOGLOBIN  Date Value Ref Range Status  10/23/2014 8.8* 12.0 - 15.0 g/dL Final  16/10/960405/10/2012 54.011.8* 12.2 - 16.2 g/dL Final   HCT  Date Value Ref Range Status  10/23/2014 27.2* 36.0 - 46.0 % Final   HCT, POC  Date Value Ref Range Status  04/21/2012 37.3* 37.7 - 47.9 % Final    Physical Exam:  General: alert, cooperative and no distress Lochia: appropriate Uterine Fundus: firm Incision: healing well DVT Evaluation: No evidence of DVT seen on physical exam.  Discharge Diagnoses: Term Pregnancy-delivered / IDA of pregnancy with ABL / gestational hypertension - delivered & resolving  Discharge Information: Date: 10/24/2014 Activity: pelvic rest Diet: routine Medications: PNV, Ibuprofen, Iron, Percocet and magnesium Condition: stable Instructions: refer to practice specific booklet Discharge to: home Follow-up Information    Follow up with Marlinda MikeBAILEY, Vestal Crandall, CNM. Schedule an appointment as soon as possible for a visit in 6 weeks.   Specialty:  Obstetrics and Gynecology   Contact information:   [address] [place] [id number] [phone]       Newborn Data: Live born female  Birth Weight: 8 lb 7.1 oz (3830 g) APGAR: 9, 9  Home with mother.  Marlinda MikeBAILEY, Javiel Canepa 10/24/2014, 10:30 AM

## 2014-10-24 NOTE — Plan of Care (Signed)
Problem: Discharge Progression Outcomes Goal: Other Discharge Outcomes/Goals Outcome: Not Applicable Date Met:  29/04/75

## 2014-10-24 NOTE — Plan of Care (Signed)
Problem: Discharge Progression Outcomes Goal: Discharge plan in place and appropriate Outcome: Completed/Met Date Met:  10/24/14

## 2014-10-24 NOTE — Progress Notes (Signed)
PPD 2 SVD  S:  Reports feeling well - no concerns or pain             Tolerating po/ No nausea or vomiting             Bleeding is light             Pain controlled with motrin and percocet             Up ad lib / ambulatory / voiding QS  Newborn breast & formula feeding  - difficulty latch / wearing nipple shields to pull out nipples for better latch              Circumcision done  O:               VS: BP 118/70 mmHg  Pulse 103  Temp(Src) 97.5 F (36.4 C) (Oral)  Resp 18  Ht 5\' 10"  (1.778 m)  Wt 111.585 kg (246 lb)  BMI 35.30 kg/m2  SpO2 99%  Breastfeeding? Unknown              BP stable : 118/70 - 120/74 - 117/66 - 123/68               Recent Labs  10/22/14 2045 10/23/14 0558  WBC 17.5* 15.7*  HGB 9.7* 8.8*  PLT 161 143*               Blood type: --/--/O POS, O POS (11/10 2027)  Rubella: Immune (04/30 0000)                     Physical Exam:             Alert and oriented X3  Abdomen: soft, non-tender, non-distended              Fundus: firm, non-tender, U-1  Perineum: intact  Lochia: light  Extremities: trace dependent - no pitting edema, no calf pain or tenderness    A: PPD # 2             IDA of pregnancy compounded by ABL PP  Doing well - stable status  P: Routine post partum orders  DC home today  Marlinda MikeBAILEY, Giordana Weinheimer CNM, MSN, [facility]NM 10/24/2014, 9:59 AM

## 2014-10-24 NOTE — Progress Notes (Signed)
Clinical Social Work Department PSYCHOSOCIAL ASSESSMENT - MATERNAL/CHILD 10/24/2014  Patient:  Stacie Simpson, Stacie Simpson  Account Number:  [id number]  [place] Date:  10/21/2014  Ardine Eng Name:   Leanna Sato    Clinical Social Worker:  Lucita Ferrara, CLINICAL SOCIAL WORKER   Date/Time:  10/24/2014 08:45 AM  Date Referred:  10/22/2014   Referral source  Central Nursery     Referred reason  Depression/Anxiety   Other referral source:    I:  FAMILY / [place] legal guardian:  PARENT  Guardian - Name Guardian - Age Guardian - Address  Melanye H[address] [place], [place] [address]   Other household support members/support persons Name Relationship DOB   MOTHER    Other support:   MOB and FOB identified their family and friends as supportive.  MOB shared that they live with her mother and discussed belief that she is highly involved and supportive.    II  PSYCHOSOCIAL DATA Information Source:  Family Interview  Financial and [employer] Employment:   MOB stated that she was working at [employer] prior to the baby being born.  The FOB shared that he works for a [employer] in [place] and works 3rd shift.   Financial resources:  Medicaid If Medicaid - County:  [place] / Grade:  N/A Music therapist / Child Services Coordination / Early Interventions:   None reported  Cultural issues impacting care:   None reported    III  STRENGTHS Strengths  Adequate Resources  Home prepared for Child (including basic supplies)  Supportive family/friends   Strength comment:  MOB has identiifed Dr. Suzan Slick as the pediatrician.   IV  RISK FACTORS AND CURRENT PROBLEMS Current Problem:  YES   Risk Factor & Current Problem Patient Issue Family Issue Risk Factor / Current Problem Comment  Mental Illness Y N MOB presents with history of anxiety and depression.  MOB presents with history of treatment, but denied any  symptoms or treatment since 2013.    V  SOCIAL WORK ASSESSMENT CSW met with the MOB in her room in order to complete the assessment.  MOB provided consent for the FOB to be present.  MOB and FOB presented as easily engaged and receptive to the visit  She displayed a full range in affect and presented in a pleasant mood.  She smiled when she reflected upon her new role as a mother, and presented as attentive and bonding with the baby.  MOB did not present with any acute mental health symptoms, and expressed awareness of available resources if she experiences symptoms in the future.   MOB and FOB expressed excitement as they transition into the postpartum period and become first time parents. They identified and reflected upon the strong support system that they have within their family and friends. MOB stated that they live with her mother, and that her mother provides help without even needing to ask for it.  She reported belief that she is well prepared, including have all basic baby supplies.  MOB reported looking forward to staying home with the baby, but the FOB expressed some anxiety about returning to work since he works in [place] and third shift.  He shared that he is already anticipating worrying about the MOB and the baby when he is away at work, and expressed belief that texting the MOB during his breaks will be sufficient to reduce his worries.    MOB acknowledged history of  anxiety and depression.  She stated that she experienced symptoms starting in early high school, including frequently crying and low motivation to go to school.  MOB shared that she participated in therapy and medication management, but discontinued medication management and therapy about 18 months ago. She shared that she had good rapport with her therapist, and her therapist continues to reach out to her mother on periodic basis to ensure that the MOB continues to be "feeling well".  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  CSW noted that the MOB presents with an admission to behavioral health in 2013 and was referred to outpatient providers, which is congruent with MOB's report.  MOB acknowledged education on postpartum depression and denied presence of any barriers that may negatively her ability to access mental health resources in the future.  She reported confidence in her ability to talk to her mother, the FOB, and her previous therapist if needed.   No barriers to discharge.    VI SOCIAL WORK PLAN Social Work Therapist, art  No Further Intervention Required / No Barriers to Discharge   Type of pt/family education:   Postpartum depression   If child protective services report - county:   If child protective services report - date:   Information/referral to community resources comment:   No referrals needed.   Other social work plan:   CSW to follow-up PRN.

## 2014-10-24 NOTE — Plan of Care (Signed)
Problem: Discharge Progression Outcomes Goal: Barriers To Progression Addressed/Resolved Outcome: Completed/Met Date Met:  10/24/14

## 2014-10-29 ENCOUNTER — Ambulatory Visit (HOSPITAL_COMMUNITY): Payer: Medicaid Other

## 2015-12-21 IMAGING — US US OB COMP +14 WK
2 series · 12 of 28 positions shown · non-contrast
Comparison: none

OBSTETRICS REPORT
                      (Signed Final 10/06/2014 [DATE])

Service(s) Provided
 US OB COMP + 14 WK                                    76805.1
Indications
 37 weeks gestation of pregnancy
 Basic anatomic survey                                 [id number]
 Hypertension - Gestational
Fetal Evaluation
 Num Of Fetuses:    1
 Fetal Heart Rate:  139                          bpm
 Cardiac Activity:  Observed
 Presentation:      Cephalic
 Placenta:          Posterior Fundal, above
                    cervical os
 P. Cord            Visualized, central
 Insertion:
 Amniotic Fluid
 AFI FV:      Subjectively within normal limits
 AFI Sum:     13.05   cm       47  %Tile     Larg Pckt:    4.61  cm
 RUQ:   3.74    cm   RLQ:    1.47   cm    LUQ:   4.61    cm   LLQ:    3.23   cm
Biometry
 BPD:       95  mm     G. Age:  38w 5d                CI:        69.13   70 - 86
                                                      FL/HC:      19.8   20.8 -
 HC:     364.9  mm     G. Age:  N/A        > 97   %   HC/AC:      1.06   0.92 -
 AC:     342.8  mm     G. Age:  38w 1d       87   %   FL/BPD:     75.9   71 - 87
 FL:      72.1  mm     G. Age:  37w 0d       44   %   FL/AC:      21.0   20 - 24
 HUM:     61.7  mm     G. Age:  35w 5d       45   %
 Est. FW:    3020   gm   7 lb 12 oz    > 90  %
Gestational Age
 U/S Today:     38w 0d                                        EDD:   10/20/14
 Best:          37w 1d     Det. By:  Early Ultrasound         EDD:   10/26/14
                                     (04/01/14)
Anatomy
 Cranium:          Appears normal         Aortic Arch:      Appears normal
 Fetal Cavum:      Not well visualized    Ductal Arch:      Not well visualized
 Ventricles:       Appears normal         Diaphragm:        Not well visualized
 Choroid Plexus:   Appears normal         Stomach:          Appears normal, left
                                                            sided
 Cerebellum:       Not well visualized    Abdomen:          Appears normal
 Posterior Fossa:  Appears normal         Abdominal Wall:   Not well visualized
 Nuchal Fold:      Not applicable (>20    Cord Vessels:     Not well visualized
                   wks GA)
 Face:             Appears normal         Kidneys:          Appear normal
                   (orbits and profile)
 Lips:             Appears normal         Bladder:          Appears normal
 Heart:            Not well visualized;   Spine:            Limited views
                   see comment                              appear normal
 RVOT:             Not well visualized    Lower             Not well visualized
                                          Extremities:
 LVOT:             Not well visualized    Upper             Not well visualized
 Other:  Male gender. Technicallly difficult due to advanced GA and maternal
         habitus.
Cervix Uterus Adnexa
 Cervix:       Not visualized (advanced GA >21wks)
 Uterus:       No abnormality visualized.
 Cul De Sac:   No free fluid seen.
 Left Ovary:    Within normal limits.
 Right Ovary:   Within normal limits.
 Adnexa:     No abnormality visualized.
Impression
INDICATION: 18 yr old G1P0 at 47w0d with elevated blood
 pressures for fetal ultrasound. Remote read.

[Series 1: us ob detail +14 wk · 9 of 68 slices shown (1 of 2)]
[im 4/68]
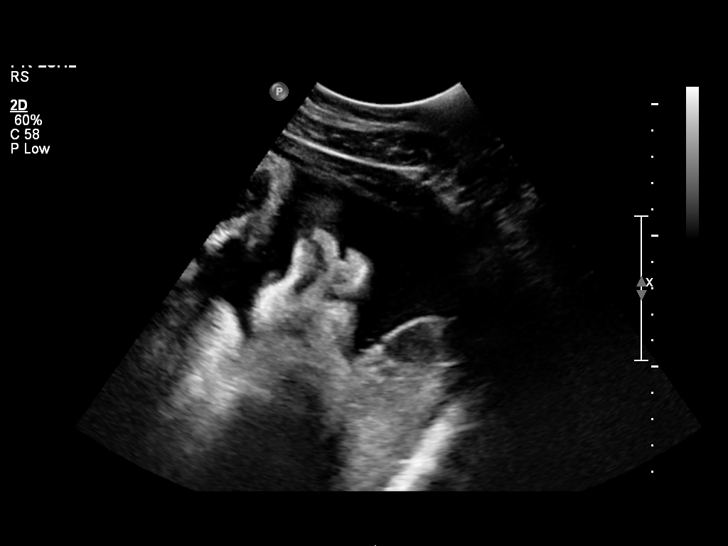
[im 10/68]
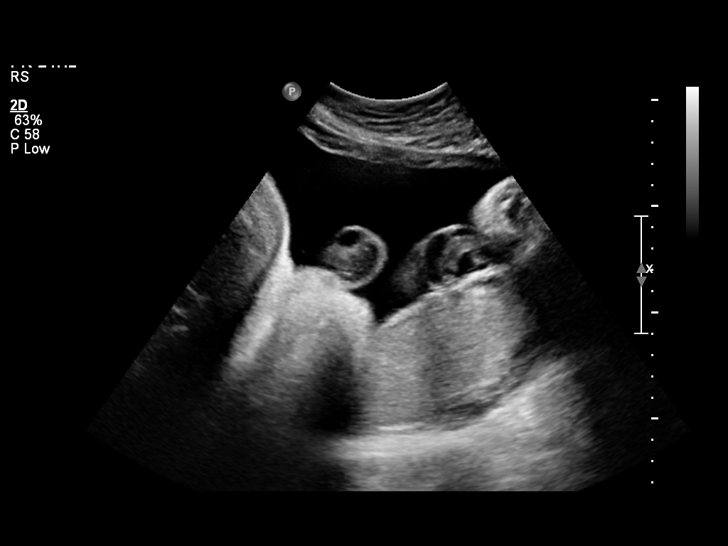
[im 16/68]
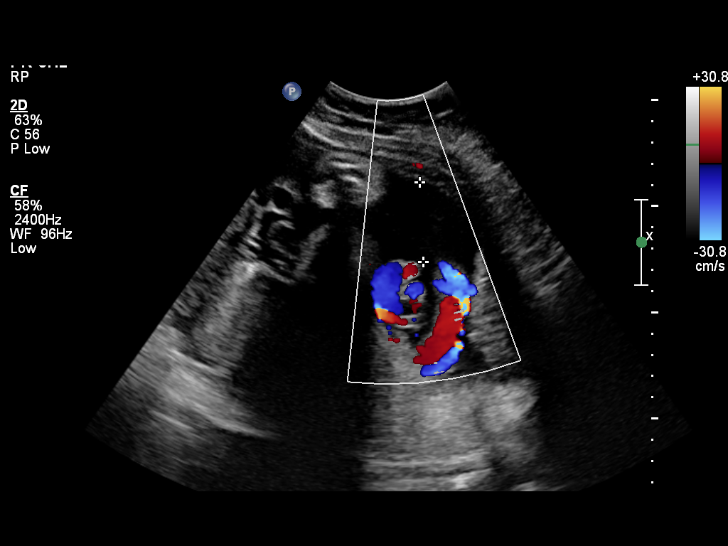
[im 26/68]
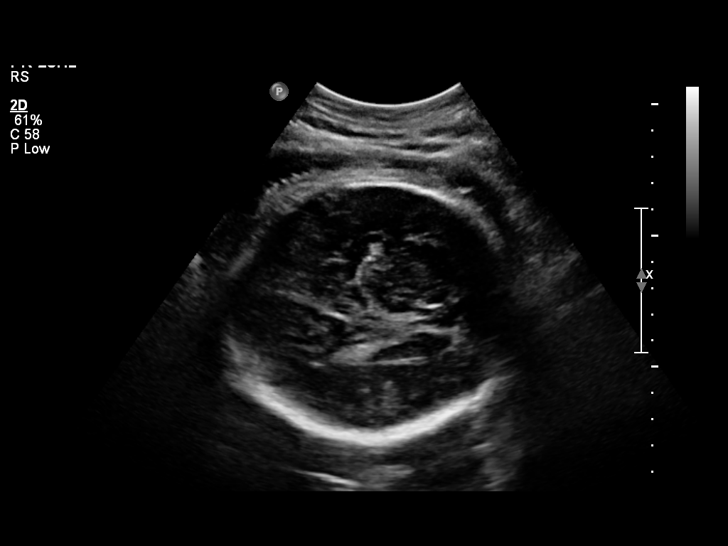
[im 32/68]
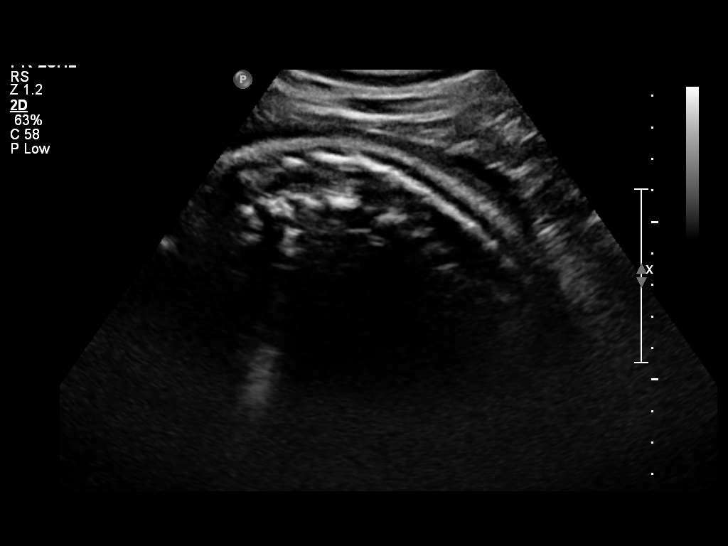
[im 39/68]
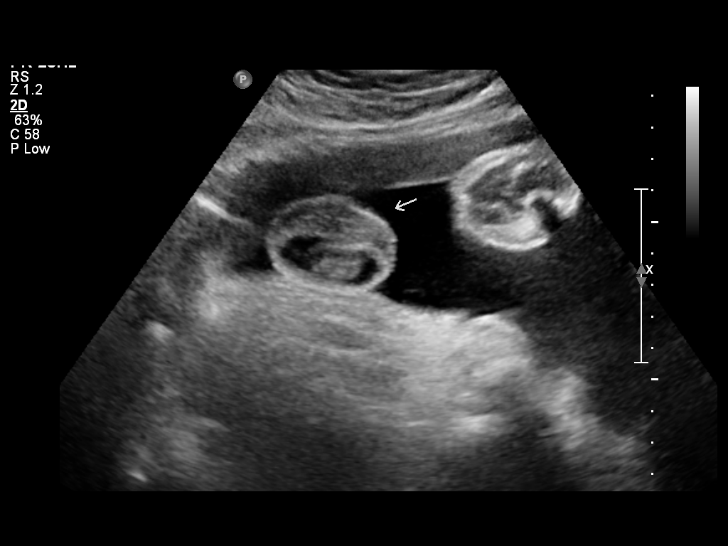
[im 48/68]
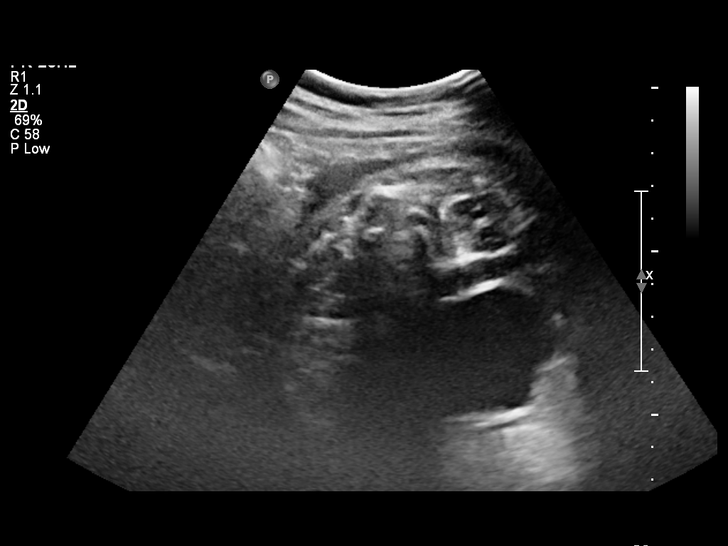
[im 55/68]
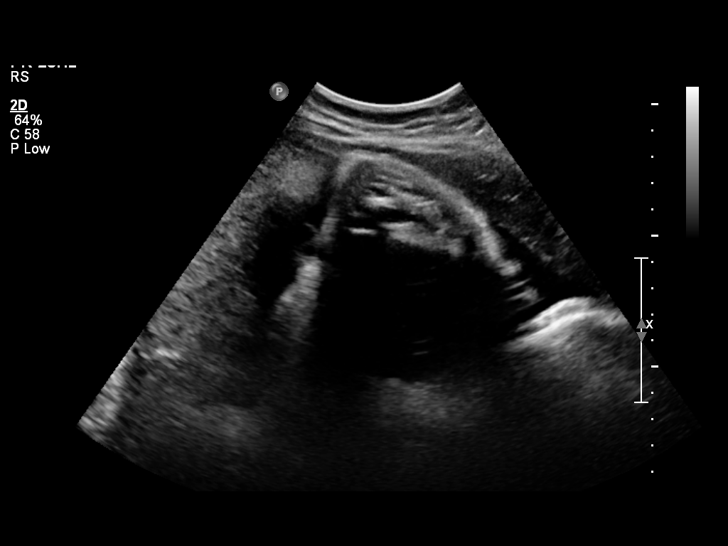
[im 61/68]
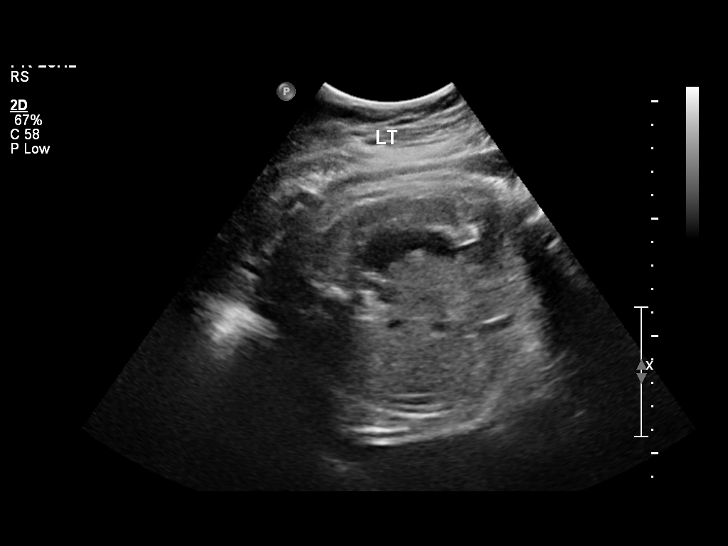

[Series 1: us ob detail +14 wk · 19 acquisitions, 3 frames shown (2 of 2)]
[im 1/19]
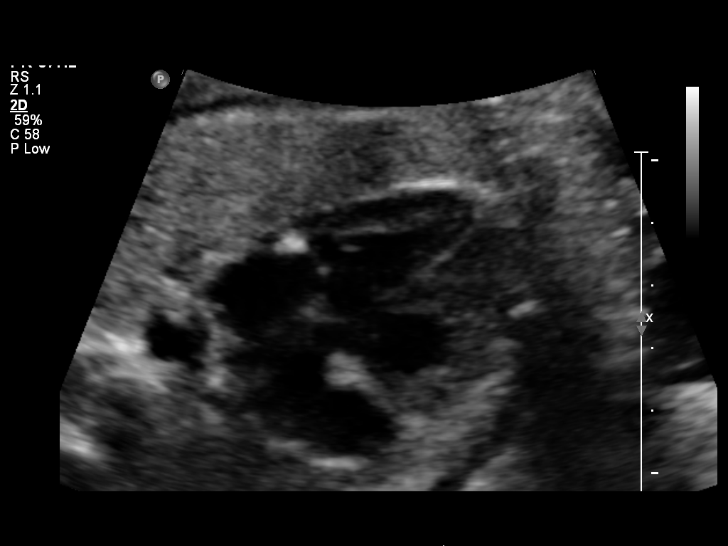
[im 8/19]
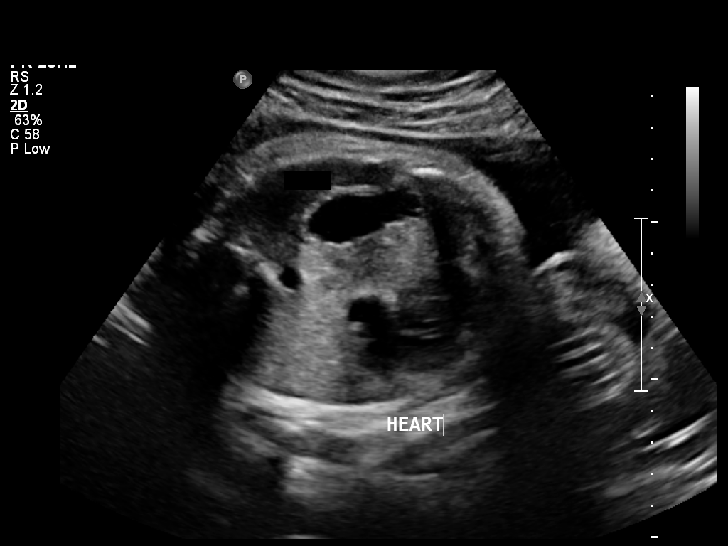
[im 15/19]
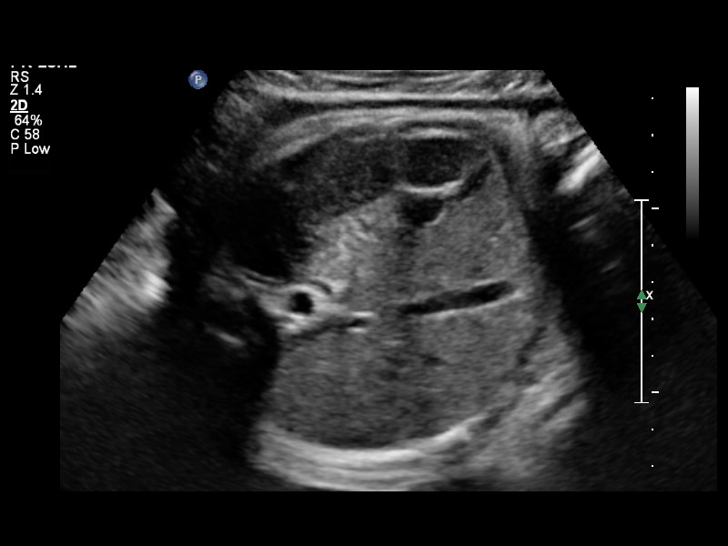

[12 of 28 positions shown; findings below may reference images not displayed]

FINDINGS: 1. Single intrauterine pregnancy.
 2. Estimated fetal weight is in the >90th%.
 3. Posterior placenta without evidence of previa.
 4. Normal amniotic fluid index.
 5. In some views there is concern for situs inversus with the
 fetal heart on the right side and stomach on the left side.
 6. The anatomy survey is very limited by advanced
 gestational age as above; no other abnormalities are seen.
 Views of the heart are very limited.
Recommendations

 1. Fetal macrosomia.
 2. Some views concerning for situs inversus:
 - recommend ultrasound with MFM tomorrow [DATE]- I will
 arrange
 - if concern for situs inversus will arrange fetal echo
 - if needs to be delivered prior recommend transfer to [facility]
 for delivery so neonatal echo can be obtained immediately
 3. Elevated BPs:
 - no consult requested
 - management per primary OB
 I discussed the above findings and concerns with Dr. Hemryk.

 questions or concerns.

## 2015-12-22 IMAGING — US US OB LIMITED
1 series · 13 of 28 positions shown · non-contrast
Comparison: none

[Series 1: us ob limited · 0.21mm/px · 13 of 38 slices shown]
[im 2/38]
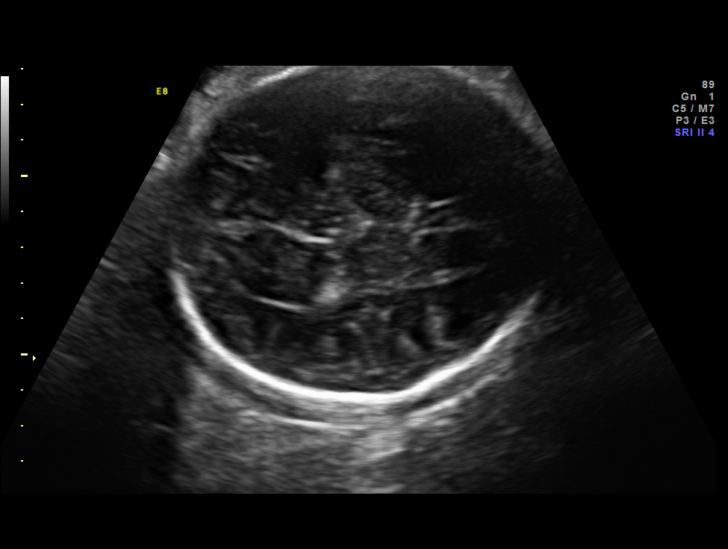
[im 5/38]
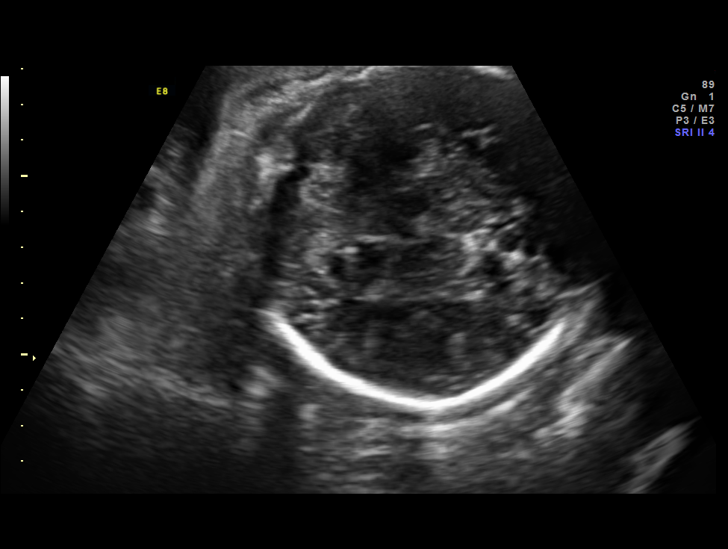
[im 7/38]
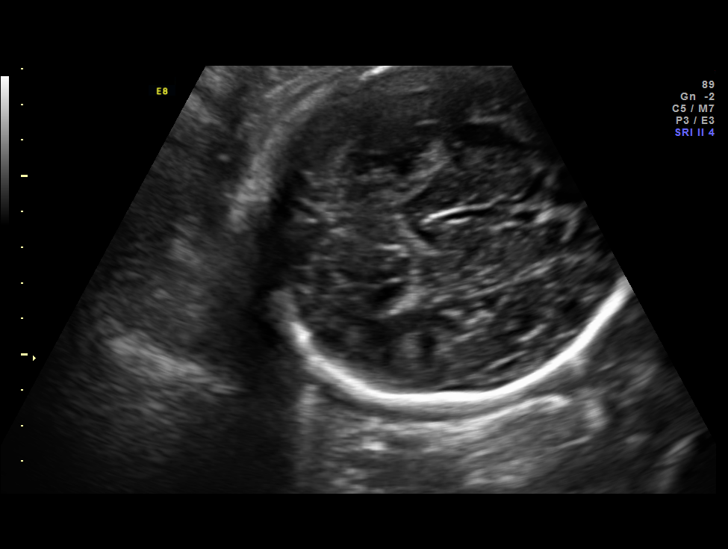
[im 10/38]
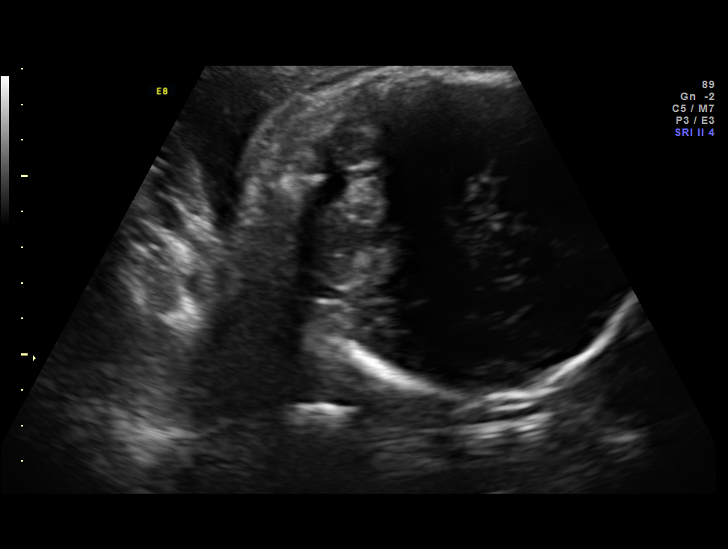
[im 13/38]
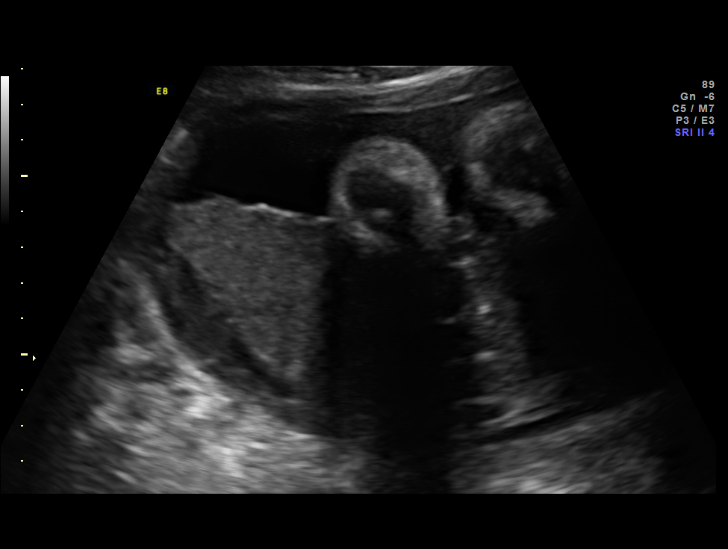
[im 16/38]
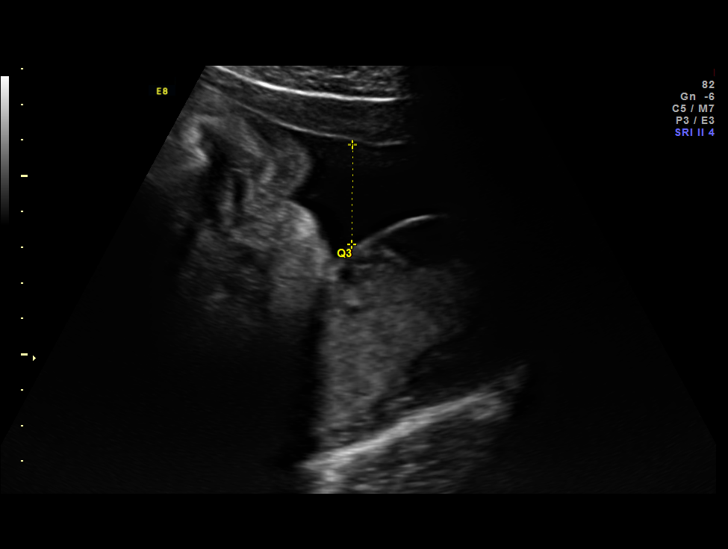
[im 20/38]
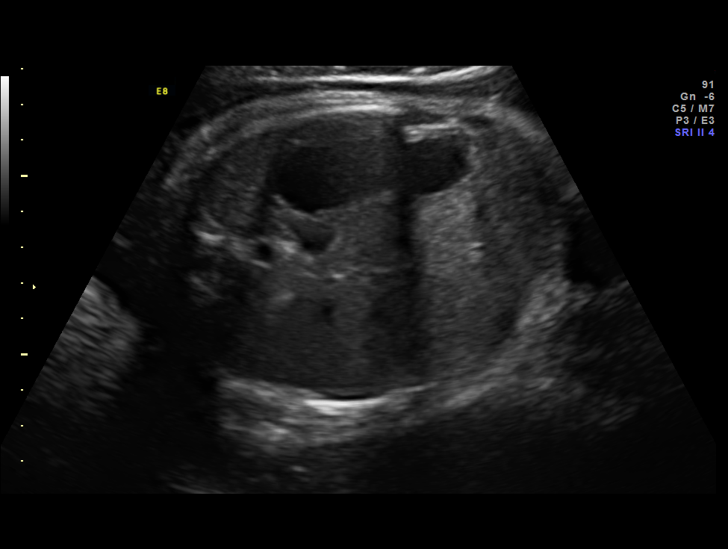
[im 22/38]
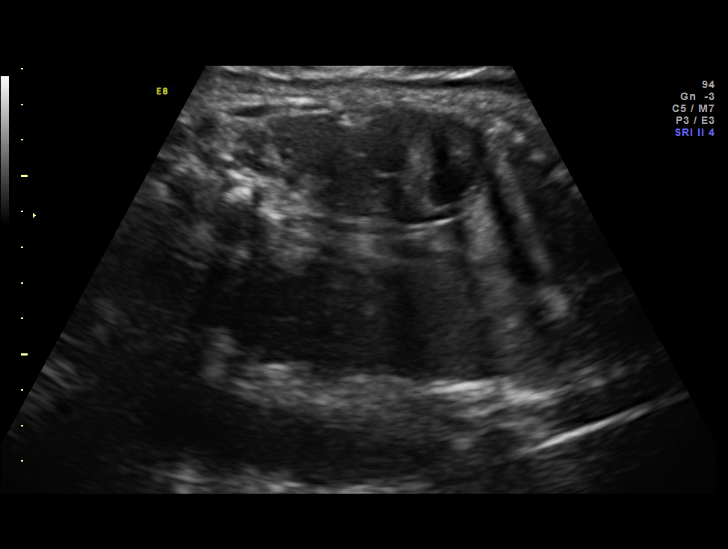
[im 25/38]
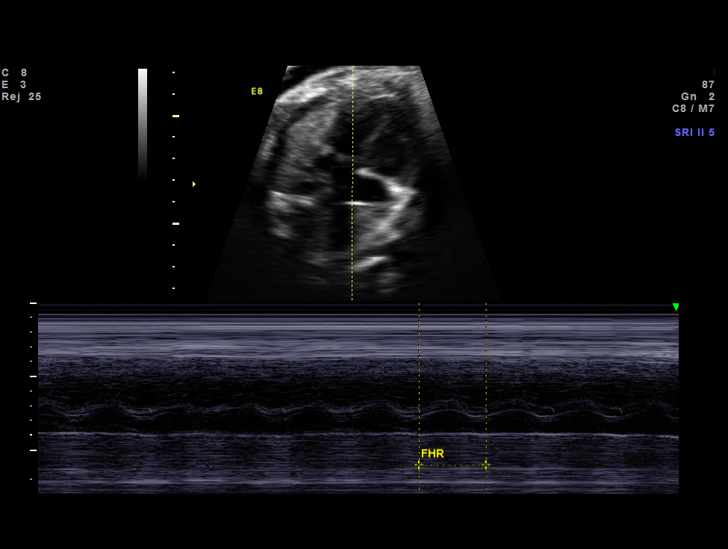
[im 28/38]
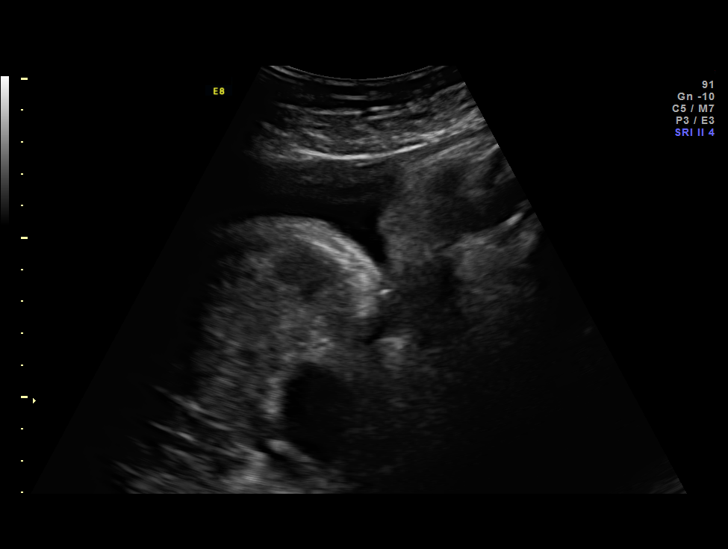
[im 31/38]
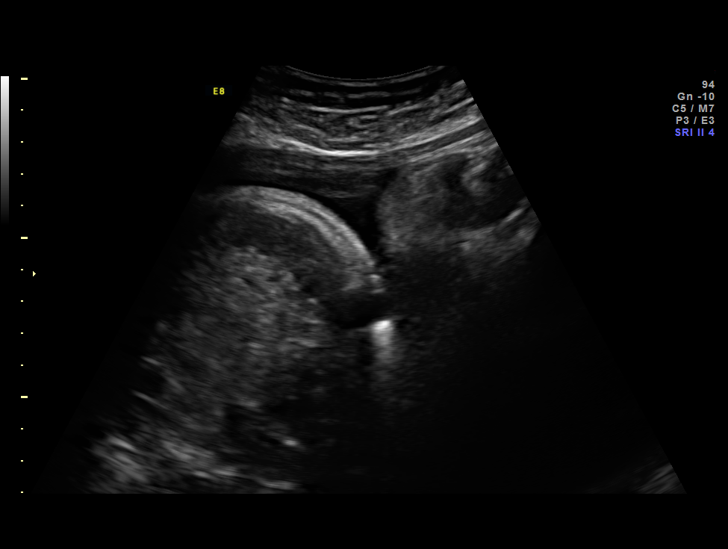
[im 33/38]
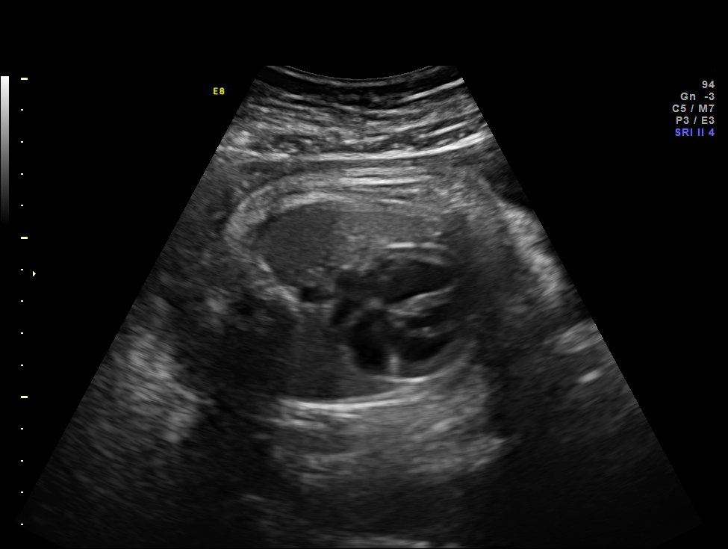
[im 36/38]
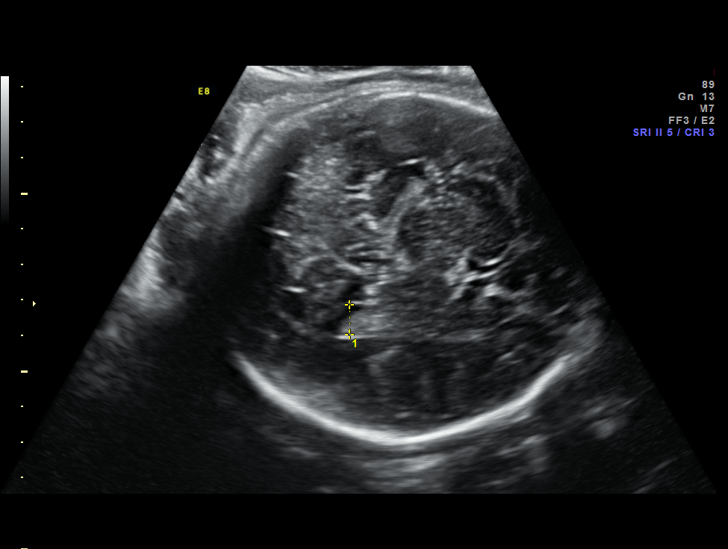

[13 of 28 positions shown; findings below may reference images not displayed]

OBSTETRICS REPORT
                      (Signed Final 10/07/2014 [DATE])

Service(s) Provided

 [HOSPITAL]                                         76815.0
Indications

 37 weeks gestation of pregnancy
 Hypertension - Gestational
 Fetal abnormality - other known or suspected
 (situs)
Fetal Evaluation

 Num Of Fetuses:    1
 Fetal Heart Rate:  150                          bpm
 Cardiac Activity:  Observed
 Presentation:      Cephalic
 Placenta:          Posterior Fundal, above
                    cervical os
 P. Cord            Previously Visualized
 Insertion:

 Amniotic Fluid
 AFI FV:      Subjectively within normal limits
 AFI Sum:     14.68   cm       55  %Tile     Larg Pckt:    4.54  cm
 RUQ:   4.54    cm   RLQ:    3.22   cm    LUQ:   4.12    cm   LLQ:    2.8    cm
Gestational Age

 Best:          37w 2d     Det. By:  Early Ultrasound         EDD:   10/26/14
                                     (04/01/14)
Anatomy

 Cranium:          Appears normal         Aortic Arch:      Previously seen
 Fetal Cavum:      Appears normal         Diaphragm:        Appears normal
 Ventricles:       Appears normal         Stomach:          Appears normal, left
                                                            sided
 Choroid Plexus:   Previously seen        Abdomen:          Appears normal
 Cerebellum:       Appears normal         Abdominal Wall:   Appears nml (cord
                                                            insert, abd wall)
 Posterior Fossa:  Previously seen        Cord Vessels:     Appears normal (3
                                                            vessel cord)
 Nuchal Fold:      Not applicable (>20    Kidneys:          Appear normal
                   wks GA)
 Face:             Orbits and profile     Bladder:          Appears normal
                   previously seen
 Lips:             Previously seen        Spine:            Limited views
                                                            previously seen
 Heart:            Appears normal         Lower             Not well visualized
                   (4CH, axis, and        Extremities:
                   situs)
 RVOT:             Appears normal         Upper             Not well visualized
                                          Extremities:
 LVOT:             Appears normal

 Other:  Male gender.
Cervix Uterus Adnexa

 Cervix:       Not visualized (advanced GA >50wks)
Impression

 SIUP at 37+2 weeks
 Normal amniotic fluid volume
 Heart and stomach on left side; no abnormalities identified
Recommendations

 Follow-up ultrasounds as clinically indicated.

 questions or concerns.

## 2020-04-22 ENCOUNTER — Other Ambulatory Visit: Payer: Self-pay

## 2020-04-22 ENCOUNTER — Inpatient Hospital Stay (HOSPITAL_COMMUNITY)
Admission: AD | Admit: 2020-04-22 | Discharge: 2020-04-22 | Disposition: A | Discharge status: Home / Self Care | Payer: BLUE CROSS/BLUE SHIELD | Attending: Obstetrics | Admitting: Obstetrics

## 2020-04-22 ENCOUNTER — Encounter (HOSPITAL_COMMUNITY): Payer: Self-pay | Admitting: Obstetrics

## 2020-04-22 DIAGNOSIS — E669 Obesity, unspecified: Secondary | ICD-10-CM | POA: Insufficient documentation

## 2020-04-22 DIAGNOSIS — N939 Abnormal uterine and vaginal bleeding, unspecified: Secondary | ICD-10-CM

## 2020-04-22 DIAGNOSIS — R197 Diarrhea, unspecified: Secondary | ICD-10-CM | POA: Diagnosis not present

## 2020-04-22 DIAGNOSIS — R110 Nausea: Secondary | ICD-10-CM | POA: Insufficient documentation

## 2020-04-22 LAB — POCT PREGNANCY, URINE: Preg Test, Ur: NEGATIVE

## 2020-04-22 NOTE — MAU Provider Note (Signed)
Chief Complaint: irreg cycle, Vaginal Bleeding, Fatigue, Shortness of Breath, Back Pain, and Abdominal Pain   First Provider Initiated Contact with Patient 04/22/20 0957      SUBJECTIVE HPI: Ms. Stacie Simpson is a 24 y.o. G1P1001 who presents to MAU for "heavy" vaginal bleeding with clots. She passed what she thought was a blood clot, but was a tampon that she states was in there "a really long time; like April or before." She reports her LMP was 5/1-5/4 then restarted on 5/9 heavily and continuing to be heavy. She reports that she thinks she has TSS, because she doesn't know exactly how long the tampon has been in her vagina, she has nausea, diarrhea. She was seen at a hospital for the same symptoms she has today and was told she was dehydrated, but no pelvic was done. She is a patient of Milinda Cave, CNM at [facility]. She is scheduled to see a provider there tomorrow, but had to leave work today d/t her symptoms.  Past Medical History:  Diagnosis Date  . Anemia   . Anxiety   . Depression   . GERD (gastroesophageal reflux disease)   . Obesity   . Pregnancy induced hypertension   . TMJ (temporomandibular joint disorder)   . Vision abnormalities    Pt wears contacts   Past Surgical History:  Procedure Laterality Date  . WISDOM TOOTH EXTRACTION  2013   Social History   Socioeconomic History  . Marital status: Single    Spouse name: Not on file  . Number of children: Not on file  . Years of education: Not on file  . Highest education level: Not on file  Occupational History  . 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 Smoking status: Never Smoker  Substance and Sexual Activity  . Alcohol use: Yes    Comment: Socially/wine or beer maybe once per month  . Drug use: Yes    Types: Marijuana    Comment: Stopped in May 2013  . Sexual activity: Yes    Partners: Female, Female    Birth control/protection: Condom, Injection  Other Topics  Concern  . Not on file  Social History Narrative  . Not on file   Social Determinants of Health   Financial Resource Strain:   . Difficulty of Paying Living Expenses:   Food Insecurity:   . Worried About Programme researcher, broadcasting/film/video in the Last Year:   . Barista in the Last Year:   Transportation Needs:   . Freight forwarder (Medical):   Marland Kitchen Lack of Transportation (Non-Medical):   Physical Activity:   . Days of Exercise per Week:   . Minutes of Exercise per Session:   Stress:   . Feeling of Stress :   Social Connections:   . Frequency of Communication with Friends and Family:   . Frequency of Social Gatherings with Friends and Family:   . Attends Religious Services:   . Active Member of Clubs or Organizations:   . Attends Banker Meetings:   Marland Kitchen Marital Status:   Intimate Partner Violence:   . Fear of Current or Ex-Partner:   . Emotionally Abused:   Marland Kitchen Physically Abused:   . Sexually Abused:    No current facility-administered medications on file prior to encounter.   Current Outpatient Medications on File Prior to Encounter  Medication Sig Dispense Refill  . fluticasone (FLONASE) 50 MCG/ACT nasal spray Place  1-2 sprays into both nostrils daily as needed for allergies or rhinitis.    Marland Kitchen loratadine (CLARITIN) 10 MG tablet Take 10 mg by mouth daily as needed for allergies.    Marland Kitchen ibuprofen (ADVIL,MOTRIN) 600 MG tablet Take 1 tablet (600 mg total) by mouth every 6 (six) hours. (Patient not taking: Reported on 04/22/2020) 30 tablet 0  . iron polysaccharides (NIFEREX) 150 MG capsule Take 1 capsule (150 mg total) by mouth daily. (Patient not taking: Reported on 04/22/2020) 30 capsule 0  . magnesium oxide (MAG-OX) 400 (241.3 MG) MG tablet Take 0.5 tablets (200 mg total) by mouth daily. (Patient not taking: Reported on 04/22/2020) 30 tablet 0  . oxyCODONE-acetaminophen (PERCOCET/ROXICET) 5-325 MG per tablet Take 1 tablet by mouth every 4 (four) hours as needed (for pain scale  less than 7). (Patient not taking: Reported on 04/22/2020) 15 tablet 0   No Known Allergies  ROS:  Review of Systems  Constitutional: Negative.   HENT: Negative.   Eyes: Negative.   Respiratory: Negative.   Cardiovascular: Negative.   Gastrointestinal: Positive for abdominal pain.  Endocrine: Negative.   Genitourinary: Positive for pelvic pain and vaginal bleeding ("heavy" passed a "clot" that she found out was a tampon that has been in there "a very long time").  Musculoskeletal: Negative.   Skin: Negative.   Allergic/Immunologic: Negative.   Neurological: Negative.   Hematological: Negative.   Psychiatric/Behavioral: Negative.     I have reviewed patient's Past Medical Hx, Surgical Hx, Family Hx, Social Hx, medications and allergies.   Physical Exam   Patient Vitals for the past 24 hrs:  BP Temp Temp src Pulse Resp SpO2 Weight  04/22/20 0943 135/86 98.1 F (36.7 C) Oral 86 18 100 % 123.8 kg   Physical Exam  Nursing note and vitals reviewed. Constitutional: She is oriented to person, place, and time. She appears well-developed and well-nourished.  HENT:  Head: Normocephalic and atraumatic.  Eyes: Pupils are equal, round, and reactive to light.  Cardiovascular: Normal rate and regular rhythm.  Respiratory: Effort normal.  GI: Soft.  Genitourinary:    Genitourinary Comments: Not done   Musculoskeletal:        General: Normal range of motion.     Cervical back: Normal range of motion.  Neurological: She is alert and oriented to person, place, and time.  Skin: Skin is warm and dry.  Psychiatric: She has a normal mood and affect. Her behavior is normal. Judgment and thought content normal.    MDM Patient denies any concerning symptoms in need of emergent evaluation. Patient advised that non-pregnant patients are no longer evaluated in MAU. She will be discharged to seek non-emergent evaluation of her complaint at [facility], Urgent care or her PCP.   ASSESSMENT MSE  Complete  PLAN Discharge patient to seek non-emergent medical care at [facility], Urgent Care or [facility].  Advised to call [facility] first to see if she could be seen today, if not make sure to keep scheduled appt tomorrow Patient verbalized an understanding of the plan of care and agrees.   Laury Deep, CNM 04/22/2020 10:08 AM

## 2020-04-22 NOTE — MAU Note (Signed)
Has been having a really weird cycle.  Has been having nausea, diarrhea x2 days- when she first wakes up, fatigue.  Last cycle only lasted 2 days- was just old dark blood, did a preg test - was neg. Was driving became dizzy, went to [facility] hosp, told possibly dehydrated. This cycle has been very heavy, thought she was passing a clot- was actually a tampon.  So thinks she might have toxic shock or be infected. (has not checked temp).  Called office, but they weren't open yet so just came here.  Has appt for tomorrow that was prev scheduled.  Denies dizziness, just "fatiqued and short of breath".  Still bleeding really heavy and small clots.

## 2020-11-22 ENCOUNTER — Encounter (HOSPITAL_COMMUNITY): Payer: Self-pay

## 2020-11-22 ENCOUNTER — Other Ambulatory Visit: Payer: Self-pay

## 2020-11-22 ENCOUNTER — Inpatient Hospital Stay (HOSPITAL_COMMUNITY)
Admission: AD | Admit: 2020-11-22 | Discharge: 2020-11-23 | Disposition: A | Discharge status: Home / Self Care | Payer: 59 | Attending: Obstetrics | Admitting: Obstetrics

## 2020-11-22 DIAGNOSIS — U071 COVID-19: Secondary | ICD-10-CM | POA: Insufficient documentation

## 2020-11-22 DIAGNOSIS — R0602 Shortness of breath: Secondary | ICD-10-CM | POA: Insufficient documentation

## 2020-11-22 DIAGNOSIS — Z3A14 14 weeks gestation of pregnancy: Secondary | ICD-10-CM | POA: Diagnosis not present

## 2020-11-22 DIAGNOSIS — Z3491 Encounter for supervision of normal pregnancy, unspecified, first trimester: Secondary | ICD-10-CM

## 2020-11-22 DIAGNOSIS — O98512 Other viral diseases complicating pregnancy, second trimester: Secondary | ICD-10-CM | POA: Insufficient documentation

## 2020-11-22 DIAGNOSIS — O26892 Other specified pregnancy related conditions, second trimester: Secondary | ICD-10-CM | POA: Diagnosis not present

## 2020-11-22 LAB — RESP PANEL BY RT-PCR (FLU A&B, COVID) ARPGX2
Influenza A by PCR: NEGATIVE
Influenza B by PCR: NEGATIVE
SARS Coronavirus 2 by RT PCR: POSITIVE — AB

## 2020-11-22 LAB — CBC WITH DIFFERENTIAL/PLATELET
Abs Immature Granulocytes: 0.03 10*3/uL (ref 0.00–0.07)
Basophils Absolute: 0 10*3/uL (ref 0.0–0.1)
Basophils Relative: 0 %
Eosinophils Absolute: 0 10*3/uL (ref 0.0–0.5)
Eosinophils Relative: 0 %
HCT: 34 % — ABNORMAL LOW (ref 36.0–46.0)
Hemoglobin: 11.3 g/dL — ABNORMAL LOW (ref 12.0–15.0)
Immature Granulocytes: 1 %
Lymphocytes Relative: 5 %
Lymphs Abs: 0.3 10*3/uL — ABNORMAL LOW (ref 0.7–4.0)
MCH: 29.4 pg (ref 26.0–34.0)
MCHC: 33.2 g/dL (ref 30.0–36.0)
MCV: 88.5 fL (ref 80.0–100.0)
Monocytes Absolute: 0.4 10*3/uL (ref 0.1–1.0)
Monocytes Relative: 7 %
Neutro Abs: 5.2 10*3/uL (ref 1.7–7.7)
Neutrophils Relative %: 87 %
Platelets: 211 10*3/uL (ref 150–400)
RBC: 3.84 MIL/uL — ABNORMAL LOW (ref 3.87–5.11)
RDW: 12.3 % (ref 11.5–15.5)
WBC: 6 10*3/uL (ref 4.0–10.5)
nRBC: 0 % (ref 0.0–0.2)

## 2020-11-22 LAB — URINALYSIS, ROUTINE W REFLEX MICROSCOPIC
Bacteria, UA: NONE SEEN
Bilirubin Urine: NEGATIVE
Glucose, UA: NEGATIVE mg/dL
Ketones, ur: 80 mg/dL — AB
Leukocytes,Ua: NEGATIVE
Nitrite: NEGATIVE
Protein, ur: NEGATIVE mg/dL
Specific Gravity, Urine: 1.013 (ref 1.005–1.030)
pH: 5 (ref 5.0–8.0)

## 2020-11-22 MED ORDER — MENTHOL 3 MG MT LOZG
1.0000 | LOZENGE | OROMUCOSAL | Status: DC | PRN
  Administered 2020-11-22: 3 mg via ORAL
  Filled 2020-11-22: qty 9

## 2020-11-22 MED ORDER — ACETAMINOPHEN 500 MG PO TABS
1000.0000 mg | ORAL_TABLET | Freq: Once | ORAL | Status: AC
  Administered 2020-11-23: 1000 mg via ORAL
  Filled 2020-11-22: qty 2

## 2020-11-22 MED ORDER — SODIUM CHLORIDE 0.9 % IV SOLN
25.0000 mg | Freq: Once | INTRAVENOUS | Status: AC
  Administered 2020-11-22: 25 mg via INTRAVENOUS
  Filled 2020-11-22: qty 1

## 2020-11-22 MED ORDER — SALINE SPRAY 0.65 % NA SOLN
1.0000 | NASAL | Status: DC | PRN
  Administered 2020-11-22: 1 via NASAL
  Filled 2020-11-22: qty 44

## 2020-11-22 NOTE — MAU Provider Note (Signed)
Chief Complaint:  Fever and Headache   Event Date/Time   First Provider Initiated Contact with Patient 11/22/20 2230     HPI: Stacie Simpson is a 24 y.o. G2P1001 at 6031w3d who presents to maternity admissions reporting symptoms that began yesterday including headache, fever of 100.3, chills, fatigue, scratchy throat, sneezing, nausea, cough and mild shortness of breath. Her husband was sick last week but tested negative for Covid via a [facility] test. Her entire family is vaccinated against Covid, she is due for her booster but wanted to wait until her 2nd trimester. Denies vaginal bleeding but is having some mild diffuse abdominal cramping that began on the ride to MAU.  Past Medical History:  Diagnosis Date  . Anemia   . Anxiety   . Depression   . GERD (gastroesophageal reflux disease)   . Obesity   . Pregnancy induced hypertension   . TMJ (temporomandibular joint disorder)   . Vision abnormalities    Pt wears contacts   OB History  Gravida Para Term Preterm AB Living  2 1 1     1   SAB IAB Ectopic Multiple Live Births        0 1    # Outcome Date GA Lbr Len/2nd Weight Sex Delivery Anes PTL Lv  2 Current           1 Term 10/22/14 1229w3d 17:44 / 00:23 8 lb 7.1 oz (3.83 kg) M Vag-Spont EPI  LIV     Birth Comments: WNL   Past Surgical History:  Procedure Laterality Date  . WISDOM TOOTH EXTRACTION  2013   Family History  Problem Relation Age of Onset  . Hypertension Father   . Hypertension Maternal Grandmother   . Hypertension Maternal Grandfather   . Diabetes Maternal Grandfather   . Heart disease Maternal Grandfather   . Hypertension Paternal Grandmother   . Hypertension Paternal Grandfather   . Cancer Paternal Grandfather        tongue  . Heart disease Paternal Grandfather    Social History   Tobacco Use  . Smoking status: Never Smoker  Substance Use Topics  . Alcohol use: Yes    Comment: Socially/wine or beer maybe once per month  . Drug use: Yes    Types: Marijuana     Comment: Stopped in May 2013   No Known Allergies No medications prior to admission.   I have reviewed patient's Past Medical Hx, Surgical Hx, Family Hx, Social Hx, medications and allergies.   ROS:  Review of Systems  Constitutional: Positive for chills and fever.  HENT: Positive for congestion, postnasal drip, sneezing and sore throat.   Eyes: Negative for photophobia and visual disturbance.  Respiratory: Positive for cough and shortness of breath. Negative for wheezing and stridor.   Cardiovascular: Negative for chest pain.  Gastrointestinal: Positive for abdominal pain and nausea.  Genitourinary: Negative for vaginal bleeding and vaginal discharge.  Musculoskeletal: Negative for neck pain and neck stiffness.  Neurological: Positive for headaches. Negative for syncope.  All other systems reviewed and are negative.  Physical Exam   Patient Vitals for the past 24 hrs:  BP Temp Temp src Pulse Resp SpO2 Height Weight  11/23/20 0219 108/61 -- -- (!) 112 -- -- -- --  11/23/20 0101 -- -- -- -- -- 96 % -- --  11/23/20 0003 120/79 -- -- (!) 113 19 -- -- --  11/22/20 2225 -- -- -- -- -- 99 % -- --  11/22/20 2220 (!) 105/56 -- -- Marland Kitchen(!)  112 -- 95 % -- --  11/22/20 2215 -- -- -- -- -- 96 % -- --  11/22/20 2210 -- -- -- -- -- 99 % -- --  11/22/20 2205 -- -- -- -- -- 100 % -- --  11/22/20 2202 (!) 109/55 -- -- (!) 112 -- -- -- --  11/22/20 2200 (!) 109/55 100 F (37.8 C) Oral (!) 113 18 100 % 5\' 10"  (1.778 m) 263 lb 9.6 oz (119.6 kg)   Constitutional: Well-developed, well-nourished female with overall ill appearance Cardiovascular: normal rate & rhythm, no murmur Respiratory: normal effort, lung sounds clear throughout GI: Abd soft, non-tender, gravid appropriate for gestational age. Pos BS x 4 MS: Extremities nontender, no edema, normal ROM Neurologic: Alert and oriented x 4.  GU: no CVA tenderness Pelvic exam deferred  FHR: 134   Labs: Results for orders placed or performed during  the hospital encounter of 11/22/20 (from the past 24 hour(s))  Urinalysis, Routine w reflex microscopic Urine, Clean Catch     Status: Abnormal   Collection Time: 11/22/20  9:53 PM  Result Value Ref Range   Color, Urine YELLOW YELLOW   APPearance CLEAR CLEAR   Specific Gravity, Urine 1.013 1.005 - 1.030   pH 5.0 5.0 - 8.0   Glucose, UA NEGATIVE NEGATIVE mg/dL   Hgb urine dipstick SMALL (A) NEGATIVE   Bilirubin Urine NEGATIVE NEGATIVE   Ketones, ur 80 (A) NEGATIVE mg/dL   Protein, ur NEGATIVE NEGATIVE mg/dL   Nitrite NEGATIVE NEGATIVE   Leukocytes,Ua NEGATIVE NEGATIVE   RBC / HPF 0-5 0 - 5 RBC/hpf   WBC, UA 0-5 0 - 5 WBC/hpf   Bacteria, UA NONE SEEN NONE SEEN   Squamous Epithelial / LPF 0-5 0 - 5   Mucus PRESENT   Resp Panel by RT-PCR (Flu A&B, Covid) Urine, Clean Catch     Status: Abnormal   Collection Time: 11/22/20 10:15 PM   Specimen: Urine, Clean Catch; Nasopharyngeal(NP) swabs in vial transport medium  Result Value Ref Range   SARS Coronavirus 2 by RT PCR POSITIVE (A) NEGATIVE   Influenza A by PCR NEGATIVE NEGATIVE   Influenza B by PCR NEGATIVE NEGATIVE  CBC with Differential/Platelet     Status: Abnormal   Collection Time: 11/22/20 11:41 PM  Result Value Ref Range   WBC 6.0 4.0 - 10.5 K/uL   RBC 3.84 (L) 3.87 - 5.11 MIL/uL   Hemoglobin 11.3 (L) 12.0 - 15.0 g/dL   HCT 14/12/21 (L) 34.1 - 96.2 %   MCV 88.5 80.0 - 100.0 fL   MCH 29.4 26.0 - 34.0 pg   MCHC 33.2 30.0 - 36.0 g/dL   RDW 22.9 79.8 - 92.1 %   Platelets 211 150 - 400 K/uL   nRBC 0.0 0.0 - 0.2 %   Neutrophils Relative % 87 %   Neutro Abs 5.2 1.7 - 7.7 K/uL   Lymphocytes Relative 5 %   Lymphs Abs 0.3 (L) 0.7 - 4.0 K/uL   Monocytes Relative 7 %   Monocytes Absolute 0.4 0.1 - 1.0 K/uL   Eosinophils Relative 0 %   Eosinophils Absolute 0.0 0.0 - 0.5 K/uL   Basophils Relative 0 %   Basophils Absolute 0.0 0.0 - 0.1 K/uL   Immature Granulocytes 1 %   Abs Immature Granulocytes 0.03 0.00 - 0.07 K/uL  Comprehensive  metabolic panel     Status: Abnormal   Collection Time: 11/22/20 11:41 PM  Result Value Ref Range   Sodium 133 (L) 135 -  145 mmol/L   Potassium 4.0 3.5 - 5.1 mmol/L   Chloride 105 98 - 111 mmol/L   CO2 17 (L) 22 - 32 mmol/L   Glucose, Bld 102 (H) 70 - 99 mg/dL   BUN <5 (L) 6 - 20 mg/dL   Creatinine, Ser 3.14 0.44 - 1.00 mg/dL   Calcium 9.2 8.9 - 97.0 mg/dL   Total Protein 7.2 6.5 - 8.1 g/dL   Albumin 3.4 (L) 3.5 - 5.0 g/dL   AST 27 15 - 41 U/L   ALT 11 0 - 44 U/L   Alkaline Phosphatase 41 38 - 126 U/L   Total Bilirubin 1.1 0.3 - 1.2 mg/dL   GFR, Estimated >26 >37 mL/min   Anion gap 11 5 - 15  Brain natriuretic peptide     Status: None   Collection Time: 11/22/20 11:41 PM  Result Value Ref Range   B Natriuretic Peptide 25.7 0.0 - 100.0 pg/mL  Troponin I (High Sensitivity)     Status: None   Collection Time: 11/22/20 11:41 PM  Result Value Ref Range   Troponin I (High Sensitivity) <2 <18 ng/L   Imaging:  DG CHEST PORT 1 VIEW  Result Date: 11/23/2020 CLINICAL DATA:  Shortness of breath, COVID. EXAM: PORTABLE CHEST 1 VIEW COMPARISON:  None. FINDINGS: The heart is normal size. No confluent airspace opacities or effusions. No acute bony abnormality. IMPRESSION: No active disease. Electronically Signed   By: Charlett Nose M.D.   On: 11/23/2020 00:45   MAU Course: Orders Placed This Encounter  Procedures  . Resp Panel by RT-PCR (Flu A&B, Covid) Nasopharyngeal Swab  . DG CHEST PORT 1 VIEW  . Urinalysis, Routine w reflex microscopic Urine, Clean Catch  . CBC with Differential/Platelet  . Comprehensive metabolic panel  . Brain natriuretic peptide  . Airborne and Contact precautions  . ED EKG  . Discharge patient   Meds ordered this encounter  Medications  . promethazine (PHENERGAN) 25 mg in sodium chloride 0.9 % 1,000 mL infusion  . acetaminophen (TYLENOL) tablet 1,000 mg  . menthol-cetylpyridinium (CEPACOL) lozenge 3 mg  . sodium chloride (OCEAN) 0.65 % nasal spray 1 spray    MDM: LR bolus with phenergan, Tylenol, nasal spray and Cepacol given with good relief of nausea and headache CXR normal ECG normal  Recommended monoclonal antibody treatment, email sent to mAB hotline. Gave general reassurance about improved outcomes in vaccinated people who get Covid, pt and [facility]OB expressed understanding and relief.  Assessment: 1. COVID-19 affecting pregnancy in first trimester   2. Shortness of breath at rest   3. Fetal heart tones present, first trimester    Plan: Discharge home in stable condition.  See AVS for additional patient information about management of Covid-19 Pt given info on mAB treatment   Follow-up Information    Ob[facility]yn, [facility]. Go to.   Why: as scheduled for ongoing prenatal care Contact information: [address] [place] [address] [phone]               Allergies as of 11/23/2020   No Known Allergies     Medication List    STOP taking these medications   ibuprofen 600 MG tablet Commonly known as: ADVIL     TAKE these medications   acetaminophen 500 MG tablet Commonly known as: TYLENOL Take 500 mg by mouth every 6 (six) hours as needed.   fluticasone 50 MCG/ACT nasal spray Commonly known as: FLONASE Place 1-2 sprays into both nostrils daily as needed for  allergies or rhinitis.   loratadine 10 MG tablet Commonly known as: CLARITIN Take 10 mg by mouth daily as needed for allergies.   prenatal multivitamin Tabs tablet Take 1 tablet by mouth daily at 12 noon.      Edd Arbour, CNM, MSN, [facility] 11/23/20 4:24 AM

## 2020-11-22 NOTE — MAU Note (Signed)
Pt states she was home last week taking care of her husband who she thinks had the flu, he was covid neg. She's having chills, fever and feeling lethargic.

## 2020-11-23 ENCOUNTER — Inpatient Hospital Stay (HOSPITAL_COMMUNITY): Payer: 59

## 2020-11-23 ENCOUNTER — Other Ambulatory Visit (HOSPITAL_COMMUNITY): Payer: Self-pay | Admitting: Family

## 2020-11-23 ENCOUNTER — Telehealth (HOSPITAL_COMMUNITY): Payer: Self-pay | Admitting: *Deleted

## 2020-11-23 ENCOUNTER — Ambulatory Visit (HOSPITAL_COMMUNITY)
Admission: RE | Admit: 2020-11-23 | Discharge: 2020-11-23 | Disposition: A | Discharge status: Home / Self Care | Payer: 59 | Source: Ambulatory Visit | Attending: Pulmonary Disease | Admitting: Pulmonary Disease

## 2020-11-23 DIAGNOSIS — U071 COVID-19: Secondary | ICD-10-CM

## 2020-11-23 DIAGNOSIS — O98512 Other viral diseases complicating pregnancy, second trimester: Secondary | ICD-10-CM | POA: Diagnosis not present

## 2020-11-23 DIAGNOSIS — Z3A14 14 weeks gestation of pregnancy: Secondary | ICD-10-CM | POA: Diagnosis not present

## 2020-11-23 DIAGNOSIS — O26892 Other specified pregnancy related conditions, second trimester: Secondary | ICD-10-CM | POA: Diagnosis not present

## 2020-11-23 DIAGNOSIS — R0602 Shortness of breath: Secondary | ICD-10-CM | POA: Diagnosis not present

## 2020-11-23 LAB — COMPREHENSIVE METABOLIC PANEL
ALT: 11 U/L (ref 0–44)
AST: 27 U/L (ref 15–41)
Albumin: 3.4 g/dL — ABNORMAL LOW (ref 3.5–5.0)
Alkaline Phosphatase: 41 U/L (ref 38–126)
Anion gap: 11 (ref 5–15)
BUN: <5 mg/dL — ABNORMAL LOW (ref 6–20)
CO2: 17 mmol/L — ABNORMAL LOW (ref 22–32)
Calcium: 9.2 mg/dL (ref 8.9–10.3)
Chloride: 105 mmol/L (ref 98–111)
Creatinine, Ser: 0.7 mg/dL (ref 0.44–1.00)
GFR, Estimated: >60 mL/min (ref >60)
Glucose, Bld: 102 mg/dL — ABNORMAL HIGH (ref 70–99)
Potassium: 4 mmol/L (ref 3.5–5.1)
Sodium: 133 mmol/L — ABNORMAL LOW (ref 135–145)
Total Bilirubin: 1.1 mg/dL (ref 0.3–1.2)
Total Protein: 7.2 g/dL (ref 6.5–8.1)

## 2020-11-23 LAB — TROPONIN I (HIGH SENSITIVITY): Troponin I (High Sensitivity): <2 ng/L (ref <18)

## 2020-11-23 LAB — BRAIN NATRIURETIC PEPTIDE: B Natriuretic Peptide: 25.7 pg/mL (ref 0.0–100.0)

## 2020-11-23 MED ORDER — SODIUM CHLORIDE 0.9 % IV SOLN: Freq: Once | INTRAVENOUS | Status: AC

## 2020-11-23 MED ORDER — ALBUTEROL SULFATE HFA 108 (90 BASE) MCG/ACT IN AERS: 2.0000 | INHALATION_SPRAY | Freq: Once | RESPIRATORY_TRACT | Status: DC | PRN

## 2020-11-23 MED ORDER — EPINEPHRINE 0.3 MG/0.3ML IJ SOAJ: 0.3000 mg | Freq: Once | INTRAMUSCULAR | Status: DC | PRN

## 2020-11-23 MED ORDER — METHYLPREDNISOLONE SODIUM SUCC 125 MG IJ SOLR: 125.0000 mg | Freq: Once | INTRAMUSCULAR | Status: DC | PRN

## 2020-11-23 MED ORDER — ACETAMINOPHEN 325 MG PO TABS
650.0000 mg | ORAL_TABLET | Freq: Once | ORAL | Status: AC
  Administered 2020-11-23: 650 mg via ORAL
  Filled 2020-11-23: qty 2

## 2020-11-23 MED ORDER — FAMOTIDINE IN NACL 20-0.9 MG/50ML-% IV SOLN: 20.0000 mg | Freq: Once | INTRAVENOUS | Status: DC | PRN

## 2020-11-23 MED ORDER — SODIUM CHLORIDE 0.9 % IV SOLN: INTRAVENOUS | Status: DC | PRN

## 2020-11-23 MED ORDER — DIPHENHYDRAMINE HCL 50 MG/ML IJ SOLN: 50.0000 mg | Freq: Once | INTRAMUSCULAR | Status: DC | PRN

## 2020-11-23 NOTE — Telephone Encounter (Signed)
Called to Discuss with patient about Covid symptoms and the use of the monoclonal antibody infusion for those with mild to moderate Covid symptoms and at a high risk of hospitalization.     Pt appears to qualify for this infusion due to co-morbid conditions and/or a member of an at-risk group in accordance with the FDA Emergency Use Authorization.   Pt started having symptoms 11/21/20 and tested positive [facility]/20 at [facility]. Her symptoms include fever, sore throat, chills, nausea, and SOB.   Pt is [redacted] weeks pregnant.   Pt informed of estimated costs and codes for billing provided.   Pt informed an APP will contact her.

## 2020-11-23 NOTE — Progress Notes (Signed)
Patient reviewed Fact Sheet for Patients, Parents, and Caregivers for Emergency Use Authorization (EUA) of Bamlanivimab for the Treatment of Coronavirus. Patient also reviewed and is agreeable to the estimated cost of treatment. Patient is agreeable to proceed.   

## 2020-11-23 NOTE — Discharge Instructions (Signed)
Pregnancy and COVID-19 Coronavirus disease, also called COVID-19, is an infection of the lungs and airways (respiratory tract). It is unclear at this time if pregnancy makes it more likely for you to get COVID-19, or what effects the infection may have on your unborn baby. However, pregnancy causes changes to your heart, lungs, and your body's disease-fighting system (immune system). Some of these changes make it more likely for you to get sick and have more serious illness. Therefore, it is important for you to take precautions in order to protect yourself and your unborn baby. There have been studies showing that obesity and diabetes may put you at higher risk for serious illness. If you are pregnant and are obese or have diabetes, you should take extra precautions to protect yourself from the virus. Work with your health care team to develop a plan to protect yourself from all infections, including COVID-19. This is one way for you to stay healthy during your pregnancy and to keep your baby healthy as well. How does this affect me? If you get COVID-19, there is a risk that you may:  Get a respiratory illness that can lead to pneumonia.  Give birth to your baby before 37 weeks of pregnancy (premature birth). If you have or may have COVID-19, your health care provider may recommend special precautions around your pregnancy. This may affect how you:  Receive care before delivery (prenatal care). How you visit your health care provider may change. Tests and scans may need to be performed differently.  Receive care during labor and delivery. This may affect your birth plan, including who may be with you during labor and delivery.  Receive care after you deliver your baby (postpartum care). You may stay longer in the hospital and in a special room.  Feed your baby after he or she is born. Pregnancy can be an especially stressful time because of the changes in your body and the preparation involved in  becoming a parent. In addition, you may be feeling especially fearful, anxious, or stressed because of COVID-19 and how it is affecting you. How does this affect my baby? It is not known whether a mother will transmit the virus to her unborn baby. There is a risk that if you get COVID-19:  The virus that causes COVID-19 can pass to your baby.  You may have premature birth. Your baby may require more medical care if this happens. What can I do to lower my risk?  There is no vaccine to help prevent COVID-19. However, there are actions that you can take to protect yourself and others from this virus. Cleaning and personal hygiene  Wash your hands often with soap and water for at least 20 seconds. If soap and water are not available, use alcohol-based hand sanitizer.  Avoid touching your mouth, face, eyes, or nose.  Clean and disinfect objects and surfaces that are frequently touched every day. These may include: ? Counters and tables. ? Doorknobs and light switches. ? Sinks and faucets. ? Electronics such as phones, remote controls, keyboards, computers, and tablets. Stay away from others  Stay away from people who are sick, if possible.  Avoid social gatherings and travel.  Stay home as much as possible. Follow these instructions: Breastfeeding It is not known if the virus that causes COVID-19 can pass through breast milk to your baby. You should make a plan for feeding your infant with your family and your health care team. If you have or may have COVID-19,  your health care provider may recommend that you take precautions while breastfeeding, such as:  Washing your hands before feeding your baby.  Wearing a mask while feeding your baby.  Pumping or expressing breast milk to feed to your baby. If possible, ask someone in your household who is not sick to feed your baby the expressed breast milk. ? Wash your hands before touching pump parts. ? Wash and disinfect all pump parts  after expressing milk. Follow the manufacturer's instructions to clean and disinfect all pump parts. General instructions  If you think you have a COVID-19 infection, contact your health care provider right away. Tell your health care provider that you think you may have a COVID-19 infection.  Follow your health care provider's instructions on taking medicines. Some medicines may be unsafe to take during pregnancy.  Cover your mouth and nose by wearing a mask or other cloth covering over your face when you go out in public.  Find ways to manage stress. These may include: ? Using relaxation techniques like meditation and deep breathing. ? Getting regular exercise. Most women can continue their usual exercise routine during pregnancy. Ask your health care provider what activities are safe for you. ? Seeking support from family, friends, or spiritual resources. If you cannot be together in person, you can still connect by phone calls, texts, video calls, or online messaging. ? Spending time doing relaxing activities that you enjoy, like listening to music or reading a good book.  Ask for help if you have counseling or nutritional needs during pregnancy. Your health care provider can offer advice or refer you to resources or specialists who can help you with various needs.  Keep all follow-up visits as told by your health care provider. This is important. Where to find more information Centers for Disease Control and Prevention (CDC): AffordableShare.com.br World Health Organization [facility]): PokerPortraits.es [employer] of Obstetricians and Gynecologists (ACOG): BuyDucts.dk Questions to ask your health care team  What should I do if I have COVID-19 symptoms?  How will COVID-19 affect my prenatal care visits, tests and scans, labor and  delivery, and postpartum care?  Should I plan to breastfeed my baby?  Where can I find mental health resources?  Where can I find support if I have financial concerns? Contact a health care provider if:  You have signs and symptoms of infection, including a fever or cough. Tell your health care team that you think you may have a COVID-19 infection.  You have strong emotions, such as sadness or anxiety.  You feel unsafe in your home and need help finding a safe place to live.  You have bloody or watery vaginal discharge or vaginal bleeding. Get help right away if:  You have signs or symptoms of labor before 37 weeks of pregnancy. These include: ? Contractions that are 5 minutes or less apart, or that increase in frequency, intensity, or length. ? Sudden, sharp pain in the abdomen or in the lower back. ? A gush or trickle of fluid from your vagina.  You have signs of more serious illness such as: ? You have difficulty breathing. ? You have chest pain. ? You have a fever greater than 102F (39C) or higher that does not go away. ? You cannot drink fluids without vomiting. ? You feel extremely weak or you faint. These symptoms may represent a serious problem that is an emergency. Do not wait to see if the symptoms will go away. Get medical help right away. Call  your local emergency services (911 in the [place] Do not drive yourself to the hospital. Summary  Coronavirus disease, also called COVID-19, is an infection of the lungs and airways (respiratory tract). It is unclear at this time if pregnancy makes you more susceptible to COVID-19 and what effects it may have on unborn babies.  It is important to take precautions to protect yourself and your developing baby. This includes washing your hands often, avoiding touching your mouth, face, eyes, or nose, avoiding social gatherings and travel, and staying away from people who are sick.  If you think you have a COVID-19 infection,  contact your health care provider right away. Tell your health care provider that you think you may have a COVID-19 infection.  If you have or may have COVID-19, your health care provider may recommend special precautions during your pregnancy, labor and delivery, and after your baby is born. This information is not intended to replace advice given to you by your health care provider. Make sure you discuss any questions you have with your health care provider. Document Revised: 09/20/2019 Document Reviewed: 03/26/2019 Elsevier Patient Education  2020 Elsevier Inc.   10 Things You Can Do to Manage Your COVID-19 Symptoms at Home If you have possible or confirmed COVID-19: 1. Stay home from work and school. And stay away from other public places. If you must go out, avoid using any kind of public transportation, ridesharing, or taxis. 2. Monitor your symptoms carefully. If your symptoms get worse, call your healthcare provider immediately. 3. Get rest and stay hydrated. 4. If you have a medical appointment, call the healthcare provider ahead of time and tell them that you have or may have COVID-19. 5. For medical emergencies, call 911 and notify the dispatch personnel that you have or may have COVID-19. 6. Cover your cough and sneezes with a tissue or use the inside of your elbow. 7. Wash your hands often with soap and water for at least 20 seconds or clean your hands with an alcohol-based hand sanitizer that contains at least 60% alcohol. 8. As much as possible, stay in a specific room and away from other people in your home. Also, you should use a separate bathroom, if available. If you need to be around other people in or outside of the home, wear a mask. 9. Avoid sharing personal items with other people in your household, like dishes, towels, and bedding. 10. Clean all surfaces that are touched often, like counters, tabletops, and doorknobs. Use household cleaning sprays or wipes according to  the label instructions. SouthAmericaFlowers.co.ukcdc.gov/coronavirus 06/12/2019 This information is not intended to replace advice given to you by your health care provider. Make sure you discuss any questions you have with your health care provider. Document Revised: 11/14/2019 Document Reviewed: 11/14/2019 Elsevier Patient Education  2020 Elsevier Inc.  COVID-19: Quarantine vs. Isolation QUARANTINE keeps someone who was in close contact with someone who has COVID-19 away from others. If you had close contact with a person who has COVID-19  Stay home until 14 days after your last contact.  Check your temperature twice a day and watch for symptoms of COVID-19.  If possible, stay away from people who are at higher-risk for getting very sick from COVID-19. ISOLATION keeps someone who is sick or tested positive for COVID-19 without symptoms away from others, even in their own home. If you are sick and think or know you have COVID-19  Stay home until after ? At least 10 days since symptoms  first appeared and ? At least 24 hours with no fever without fever-reducing medication and ? Symptoms have improved If you tested positive for COVID-19 but do not have symptoms  Stay home until after ? 10 days have passed since your positive test If you live with others, stay in a specific "sick room" or area and away from other people or animals, including pets. Use a separate bathroom, if available. SouthAmericaFlowers.co.uk 07/01/2019 This information is not intended to replace advice given to you by your health care provider. Make sure you discuss any questions you have with your health care provider. Document Revised: 11/14/2019 Document Reviewed: 11/14/2019 Elsevier Patient Education  2020 [employer].   COVID-19 Frequently Asked Questions COVID-19 (coronavirus disease) is an infection that is caused by a large family of viruses. Some viruses cause illness in people and others cause illness in animals like camels, cats,  and bats. In some cases, the viruses that cause illness in animals can spread to humans. Where did the coronavirus come from? In December 2019, [place] told the [employer] [facility]) of several cases of lung disease (human respiratory illness). These cases were linked to an open seafood and livestock market in the city of [place]. The link to the seafood and livestock market suggests that the virus may have spread from animals to humans. However, since that first outbreak in December, the virus has also been shown to spread from person to person. What is the name of the disease and the virus? Disease name Early on, this disease was called novel coronavirus. This is because scientists determined that the disease was caused by a new (novel) respiratory virus. The World Health Organization [facility]) has now named the disease COVID-19, or coronavirus disease. Virus name The virus that causes the disease is called severe acute respiratory syndrome coronavirus 2 (SARS-CoV-2). More information on disease and virus naming World Health Organization [facility]): www.who.int/emergencies/diseases/novel-coronavirus-2019/technical-guidance/naming-the-coronavirus-disease-(covid-2019)-and-the-virus-that-causes-it Who is at risk for complications from coronavirus disease? Some people may be at higher risk for complications from coronavirus disease. This includes older adults and people who have chronic diseases, such as heart disease, diabetes, and lung disease. If you are at higher risk for complications, take these extra precautions:  Stay home as much as possible.  Avoid social gatherings and travel.  Avoid close contact with others. Stay at least 6 ft (2 m) away from others, if possible.  Wash your hands often with soap and water for at least 20 seconds.  Avoid touching your face, mouth, nose, or eyes.  Keep supplies on hand at home, such as food, medicine, and cleaning supplies.  If you must go out in  public, wear a cloth face covering or face mask. Make sure your mask covers your nose and mouth. How does coronavirus disease spread? The virus that causes coronavirus disease spreads easily from person to person (is contagious). You may catch the virus by:  Breathing in droplets from an infected person. Droplets can be spread by a person breathing, speaking, singing, coughing, or sneezing.  Touching something, like a table or a doorknob, that was exposed to the virus (contaminated) and then touching your mouth, nose, or eyes. Can I get the virus from touching surfaces or objects? There is still a lot that we do not know about the virus that causes coronavirus disease. Scientists are basing a lot of information on what they know about similar viruses, such as:  Viruses cannot generally survive on surfaces for long. They need a human body (host)  to survive.  It is more likely that the virus is spread by close contact with people who are sick (direct contact), such as through: ? Shaking hands or hugging. ? Breathing in respiratory droplets that travel through the air. Droplets can be spread by a person breathing, speaking, singing, coughing, or sneezing.  It is less likely that the virus is spread when a person touches a surface or object that has the virus on it (indirect contact). The virus may be able to enter the body if the person touches a surface or object and then touches his or her face, eyes, nose, or mouth. Can a person spread the virus without having symptoms of the disease? It may be possible for the virus to spread before a person has symptoms of the disease, but this is most likely not the main way the virus is spreading. It is more likely for the virus to spread by being in close contact with people who are sick and breathing in the respiratory droplets spread by a person breathing, speaking, singing, coughing, or sneezing. What are the symptoms of coronavirus disease? Symptoms vary  from person to person and can range from mild to severe. Symptoms may include:  Fever or chills.  Cough.  Difficulty breathing or feeling short of breath.  Headaches, body aches, or muscle aches.  Runny or stuffy (congested) nose.  Sore throat.  New loss of taste or smell.  Nausea, vomiting, or diarrhea. These symptoms can appear anywhere from 2 to 14 days after you have been exposed to the virus. Some people may not have any symptoms. If you develop symptoms, call your health care provider. People with severe symptoms may need hospital care. Should I be tested for this virus? Your health care provider will decide whether to test you based on your symptoms, history of exposure, and your risk factors. How does a health care provider test for this virus? Health care providers will collect samples to send for testing. Samples may include:  Taking a swab of fluid from the back of your nose and throat, your nose, or your throat.  Taking fluid from the lungs by having you cough up mucus (sputum) into a sterile cup.  Taking a blood sample. Is there a treatment or vaccine for this virus? Currently, there is no vaccine to prevent coronavirus disease. Also, there are no medicines like antibiotics or antivirals to treat the virus. A person who becomes sick is given supportive care, which means rest and fluids. A person may also relieve his or her symptoms by using over-the-counter medicines that treat sneezing, coughing, and runny nose. These are the same medicines that a person takes for the common cold. If you develop symptoms, call your health care provider. People with severe symptoms may need hospital care. What can I do to protect myself and my family from this virus?     You can protect yourself and your family by taking the same actions that you would take to prevent the spread of other viruses. Take the following actions:  Wash your hands often with soap and water for at least 20  seconds. If soap and water are not available, use alcohol-based hand sanitizer.  Avoid touching your face, mouth, nose, or eyes.  Cough or sneeze into a tissue, sleeve, or elbow. Do not cough or sneeze into your hand or the air. ? If you cough or sneeze into a tissue, throw it away immediately and wash your hands.  Disinfect objects and  surfaces that you frequently touch every day.  Stay away from people who are sick.  Avoid going out in public, follow guidance from your state and local health authorities.  Avoid crowded indoor spaces. Stay at least 6 ft (2 m) away from others.  If you must go out in public, wear a cloth face covering or face mask. Make sure your mask covers your nose and mouth.  Stay home if you are sick, except to get medical care. Call your health care provider before you get medical care. Your health care provider will tell you how long to stay home.  Make sure your vaccines are up to date. Ask your health care provider what vaccines you need. What should I do if I need to travel? Follow travel recommendations from your local health authority, the CDC, and WHO. Travel information and advice  Centers for Disease Control and Prevention (CDC): GeminiCard.gl  World Health Organization [facility]): PreviewDomains.se Know the risks and take action to protect your health  You are at higher risk of getting coronavirus disease if you are traveling to areas with an outbreak or if you are exposed to travelers from areas with an outbreak.  Wash your hands often and practice good hygiene to lower the risk of catching or spreading the virus. What should I do if I am sick? General instructions to stop the spread of infection  Wash your hands often with soap and water for at least 20 seconds. If soap and water are not available, use alcohol-based hand sanitizer.  Cough or sneeze into a  tissue, sleeve, or elbow. Do not cough or sneeze into your hand or the air.  If you cough or sneeze into a tissue, throw it away immediately and wash your hands.  Stay home unless you must get medical care. Call your health care provider or local health authority before you get medical care.  Avoid public areas. Do not take public transportation, if possible.  If you can, wear a mask if you must go out of the house or if you are in close contact with someone who is not sick. Make sure your mask covers your nose and mouth. Keep your home clean  Disinfect objects and surfaces that are frequently touched every day. This may include: ? Counters and tables. ? Doorknobs and light switches. ? Sinks and faucets. ? Electronics such as phones, remote controls, keyboards, computers, and tablets.  Wash dishes in hot, soapy water or use a dishwasher. Air-dry your dishes.  Wash laundry in hot water. Prevent infecting other household members  Let healthy household members care for children and pets, if possible. If you have to care for children or pets, wash your hands often and wear a mask.  Sleep in a different bedroom or bed, if possible.  Do not share personal items, such as razors, toothbrushes, deodorant, combs, brushes, towels, and washcloths. Where to find more information Centers for Disease Control and Prevention (CDC)  Information and news updates: CardRetirement.cz World Health Organization [facility])  Information and news updates: AffordableSalon.es  Coronavirus health topic: https://thompson-craig.com/  Questions and answers on COVID-19: kruiseway.com  Global tracker: who.sprinklr.com American Academy of Pediatrics (AAP)  Information for families: www.healthychildren.org/English/health-issues/conditions/chest-lungs/Pages/2019-Novel-Coronavirus.aspx The coronavirus situation is  changing rapidly. Check your local health authority website or the CDC and [facility] websites for updates and news. When should I contact a health care provider?  Contact your health care provider if you have symptoms of an infection, such as fever or cough, and you: ?  Have been near anyone who is known to have coronavirus disease. ? Have come into contact with a person who is suspected to have coronavirus disease. ? Have traveled to an area where there is an outbreak of COVID-19. When should I get emergency medical care?  Get help right away by calling your local emergency services (911 in the [place] if you have: ? Trouble breathing. ? Pain or pressure in your chest. ? Confusion. ? Blue-tinged lips and fingernails. ? Difficulty waking from sleep. ? Symptoms that get worse. Let the emergency medical personnel know if you think you have coronavirus disease. Summary  A new respiratory virus is spreading from person to person and causing COVID-19 (coronavirus disease).  The virus that causes COVID-19 appears to spread easily. It spreads from one person to another through droplets from breathing, speaking, singing, coughing, or sneezing.  Older adults and those with chronic diseases are at higher risk of disease. If you are at higher risk for complications, take extra precautions.  There is currently no vaccine to prevent coronavirus disease. There are no medicines, such as antibiotics or antivirals, to treat the virus.  You can protect yourself and your family by washing your hands often, avoiding touching your face, and covering your coughs and sneezes. This information is not intended to replace advice given to you by your health care provider. Make sure you discuss any questions you have with your health care provider. Document Revised: 09/27/2019 Document Reviewed: 03/26/2019 Elsevier Patient Education  2020 [employer].  [facility] [employer] [facility] Prenatal [facility]  Providers          [facility] for [employer] locations:  Hours may vary. Please call for an appointment  Center for [facility] @ [facility]  [address] [phone]   Center for [facility]  [address] [phone]

## 2020-11-23 NOTE — Progress Notes (Signed)
I connected by phone with Stacie Simpson on 11/23/2020 at 3:21 PM to discuss the potential use of a new treatment for mild to moderate COVID-19 viral infection in non-hospitalized patients.  This patient is a 24 y.o. female that meets the FDA criteria for Emergency Use Authorization of COVID monoclonal antibody casirivimab/imdevimab, bamlanivimab/eteseviamb, or sotrovimab.  Has a (+) direct SARS-CoV-2 viral test result  Has mild or moderate COVID-19   Is NOT hospitalized due to COVID-19  Is within 10 days of symptom onset  Has at least one of the high risk factor(s) for progression to severe COVID-19 and/or hospitalization as defined in EUA.  Specific high risk criteria : BMI > 25 and Pregnancy   Symptoms of cough, fatigue, sneezing, SOB, fever began 11/21/20.   I have spoken and communicated the following to the patient or parent/caregiver regarding COVID monoclonal antibody treatment:  1. FDA has authorized the emergency use for the treatment of mild to moderate COVID-19 in adults and pediatric patients with positive results of direct SARS-CoV-2 viral testing who are 73 years of age and older weighing at least 40 kg, and who are at high risk for progressing to severe COVID-19 and/or hospitalization.  2. The significant known and potential risks and benefits of COVID monoclonal antibody, and the extent to which such potential risks and benefits are unknown.  3. Information on available alternative treatments and the risks and benefits of those alternatives, including clinical trials.  4. Patients treated with COVID monoclonal antibody should continue to self-isolate and use infection control measures (e.g., wear mask, isolate, social distance, avoid sharing personal items, clean and disinfect "high touch" surfaces, and frequent handwashing) according to CDC guidelines.   5. The patient or parent/caregiver has the option to accept or refuse COVID monoclonal antibody treatment.  After  reviewing this information with the patient, the patient has agreed to receive one of the available covid 19 monoclonal antibodies and will be provided an appropriate fact sheet prior to infusion. Morton Stall, NP 11/23/2020 3:21 PM

## 2020-11-23 NOTE — Telephone Encounter (Signed)
Called to Discuss with patient about Covid symptoms and the use of the monoclonal antibody infusion for those with mild to moderate Covid symptoms and at a high risk of hospitalization.     Pt appears to qualify for this infusion due to co-morbid conditions and/or a member of an at-risk group in accordance with the FDA Emergency Use Authorization.    Unable to reach pt    

## 2020-11-23 NOTE — Progress Notes (Signed)
  Diagnosis: COVID-19  Physician:Dr Wright   Procedure: Covid Infusion Clinic Med: bamlanivimab\etesevimab infusion - Provided patient with bamlanimivab\etesevimab fact sheet for patients, parents and caregivers prior to infusion.  Complications: No immediate complications noted.  Discharge: Discharged home   Berthe Oley W 11/23/2020  

## 2020-11-23 NOTE — Discharge Instructions (Signed)
10 Things You Can Do to Manage Your COVID-19 Symptoms at Home If you have possible or confirmed COVID-19: 1. Stay home from work and school. And stay away from other public places. If you must go out, avoid using any kind of public transportation, ridesharing, or taxis. 2. Monitor your symptoms carefully. If your symptoms get worse, call your healthcare provider immediately. 3. Get rest and stay hydrated. 4. If you have a medical appointment, call the healthcare provider ahead of time and tell them that you have or may have COVID-19. 5. For medical emergencies, call 911 and notify the dispatch personnel that you have or may have COVID-19. 6. Cover your cough and sneezes with a tissue or use the inside of your elbow. 7. Wash your hands often with soap and water for at least 20 seconds or clean your hands with an alcohol-based hand sanitizer that contains at least 60% alcohol. 8. As much as possible, stay in a specific room and away from other people in your home. Also, you should use a separate bathroom, if available. If you need to be around other people in or outside of the home, wear a mask. 9. Avoid sharing personal items with other people in your household, like dishes, towels, and bedding. 10. Clean all surfaces that are touched often, like counters, tabletops, and doorknobs. Use household cleaning sprays or wipes according to the label instructions. cdc.gov/coronavirus 06/12/2019 This information is not intended to replace advice given to you by your health care provider. Make sure you discuss any questions you have with your health care provider. Document Revised: 11/14/2019 Document Reviewed: 11/14/2019 Elsevier Patient Education  2020 Elsevier Inc. What types of side effects do monoclonal antibody drugs cause?  Common side effects  In general, the more common side effects caused by monoclonal antibody drugs include: . Allergic reactions, such as hives or itching . Flu-like signs and  symptoms, including chills, fatigue, fever, and muscle aches and pains . Nausea, vomiting . Diarrhea . Skin rashes . Low blood pressure   The CDC is recommending patients who receive monoclonal antibody treatments wait at least 90 days before being vaccinated.  Currently, there are no data on the safety and efficacy of mRNA COVID-19 vaccines in persons who received monoclonal antibodies or convalescent plasma as part of COVID-19 treatment. Based on the estimated half-life of such therapies as well as evidence suggesting that reinfection is uncommon in the 90 days after initial infection, vaccination should be deferred for at least 90 days, as a precautionary measure until additional information becomes available, to avoid interference of the antibody treatment with vaccine-induced immune responses. If you have any questions or concerns after the infusion please call the Advanced Practice Provider on call at [phone]. This number is ONLY intended for your use regarding questions or concerns about the infusion post-treatment side-effects.  Please do not provide this number to others for use. For return to work notes please contact your primary care provider.   If someone you know is interested in receiving treatment please have them call the COVID hotline at [phone].   

## 2020-12-02 ENCOUNTER — Encounter (HOSPITAL_COMMUNITY): Payer: Self-pay | Admitting: Obstetrics and Gynecology

## 2020-12-02 ENCOUNTER — Inpatient Hospital Stay (HOSPITAL_COMMUNITY)
Admission: AD | Admit: 2020-12-02 | Discharge: 2020-12-03 | Disposition: A | Discharge status: Home / Self Care | Payer: 59 | Attending: Obstetrics and Gynecology | Admitting: Obstetrics and Gynecology

## 2020-12-02 ENCOUNTER — Inpatient Hospital Stay (HOSPITAL_COMMUNITY): Payer: 59

## 2020-12-02 ENCOUNTER — Other Ambulatory Visit: Payer: Self-pay

## 2020-12-02 DIAGNOSIS — O219 Vomiting of pregnancy, unspecified: Secondary | ICD-10-CM | POA: Diagnosis not present

## 2020-12-02 DIAGNOSIS — Z8616 Personal history of COVID-19: Secondary | ICD-10-CM | POA: Insufficient documentation

## 2020-12-02 DIAGNOSIS — Z3A15 15 weeks gestation of pregnancy: Secondary | ICD-10-CM | POA: Diagnosis not present

## 2020-12-02 DIAGNOSIS — O26892 Other specified pregnancy related conditions, second trimester: Secondary | ICD-10-CM

## 2020-12-02 DIAGNOSIS — R112 Nausea with vomiting, unspecified: Secondary | ICD-10-CM | POA: Diagnosis not present

## 2020-12-02 DIAGNOSIS — R1010 Upper abdominal pain, unspecified: Secondary | ICD-10-CM | POA: Diagnosis not present

## 2020-12-02 DIAGNOSIS — R748 Abnormal levels of other serum enzymes: Secondary | ICD-10-CM

## 2020-12-02 DIAGNOSIS — R634 Abnormal weight loss: Secondary | ICD-10-CM | POA: Diagnosis not present

## 2020-12-02 DIAGNOSIS — O211 Hyperemesis gravidarum with metabolic disturbance: Secondary | ICD-10-CM | POA: Diagnosis not present

## 2020-12-02 LAB — COMPREHENSIVE METABOLIC PANEL
ALT: 99 U/L — ABNORMAL HIGH (ref 0–44)
AST: 57 U/L — ABNORMAL HIGH (ref 15–41)
Albumin: 3.5 g/dL (ref 3.5–5.0)
Alkaline Phosphatase: 45 U/L (ref 38–126)
Anion gap: 10 (ref 5–15)
BUN: <5 mg/dL — ABNORMAL LOW (ref 6–20)
CO2: 21 mmol/L — ABNORMAL LOW (ref 22–32)
Calcium: 9.3 mg/dL (ref 8.9–10.3)
Chloride: 103 mmol/L (ref 98–111)
Creatinine, Ser: 0.64 mg/dL (ref 0.44–1.00)
GFR, Estimated: >60 mL/min (ref >60)
Glucose, Bld: 91 mg/dL (ref 70–99)
Potassium: 3.1 mmol/L — ABNORMAL LOW (ref 3.5–5.1)
Sodium: 134 mmol/L — ABNORMAL LOW (ref 135–145)
Total Bilirubin: 1 mg/dL (ref 0.3–1.2)
Total Protein: 7.4 g/dL (ref 6.5–8.1)

## 2020-12-02 LAB — URINALYSIS, ROUTINE W REFLEX MICROSCOPIC
Bilirubin Urine: NEGATIVE
Glucose, UA: NEGATIVE mg/dL
Hgb urine dipstick: NEGATIVE
Ketones, ur: 20 mg/dL — AB
Leukocytes,Ua: NEGATIVE
Nitrite: NEGATIVE
Protein, ur: NEGATIVE mg/dL
Specific Gravity, Urine: 1.008 (ref 1.005–1.030)
pH: 6 (ref 5.0–8.0)

## 2020-12-02 LAB — CBC WITH DIFFERENTIAL/PLATELET
Abs Immature Granulocytes: 0.03 10*3/uL (ref 0.00–0.07)
Basophils Absolute: 0 10*3/uL (ref 0.0–0.1)
Basophils Relative: 0 %
Eosinophils Absolute: 0 10*3/uL (ref 0.0–0.5)
Eosinophils Relative: 0 %
HCT: 37.2 % (ref 36.0–46.0)
Hemoglobin: 11.9 g/dL — ABNORMAL LOW (ref 12.0–15.0)
Immature Granulocytes: 0 %
Lymphocytes Relative: 22 %
Lymphs Abs: 1.8 10*3/uL (ref 0.7–4.0)
MCH: 28.3 pg (ref 26.0–34.0)
MCHC: 32 g/dL (ref 30.0–36.0)
MCV: 88.6 fL (ref 80.0–100.0)
Monocytes Absolute: 0.5 10*3/uL (ref 0.1–1.0)
Monocytes Relative: 6 %
Neutro Abs: 5.8 10*3/uL (ref 1.7–7.7)
Neutrophils Relative %: 72 %
Platelets: 249 10*3/uL (ref 150–400)
RBC: 4.2 MIL/uL (ref 3.87–5.11)
RDW: 12.1 % (ref 11.5–15.5)
WBC: 8 10*3/uL (ref 4.0–10.5)
nRBC: 0 % (ref 0.0–0.2)

## 2020-12-02 LAB — AMYLASE: Amylase: 96 U/L (ref 28–100)

## 2020-12-02 LAB — HEPATITIS PANEL, ACUTE
HCV Ab: NONREACTIVE
Hep A IgM: NONREACTIVE
Hep B C IgM: NONREACTIVE
Hepatitis B Surface Ag: NONREACTIVE

## 2020-12-02 LAB — TYPE AND SCREEN
ABO/RH(D): O POS
Antibody Screen: NEGATIVE

## 2020-12-02 LAB — LIPASE, BLOOD: Lipase: 68 U/L — ABNORMAL HIGH (ref 11–51)

## 2020-12-02 MED ORDER — FAMOTIDINE IN NACL 20-0.9 MG/50ML-% IV SOLN
20.0000 mg | Freq: Two times a day (BID) | INTRAVENOUS | Status: DC
  Administered 2020-12-02 – 2020-12-03 (×2): 20 mg via INTRAVENOUS
  Filled 2020-12-02 (×2): qty 50

## 2020-12-02 MED ORDER — PROMETHAZINE HCL 25 MG/ML IJ SOLN: 12.5000 mg | Freq: Four times a day (QID) | INTRAMUSCULAR | Status: DC | PRN

## 2020-12-02 MED ORDER — PROMETHAZINE HCL 25 MG/ML IJ SOLN: 12.5000 mg | Freq: Four times a day (QID) | INTRAMUSCULAR | Status: DC

## 2020-12-02 MED ORDER — ACETAMINOPHEN 325 MG PO TABS: 650.0000 mg | ORAL_TABLET | ORAL | Status: DC | PRN

## 2020-12-02 MED ORDER — DOCUSATE SODIUM 100 MG PO CAPS
100.0000 mg | ORAL_CAPSULE | Freq: Every day | ORAL | Status: DC
  Administered 2020-12-02 – 2020-12-03 (×2): 100 mg via ORAL
  Filled 2020-12-02 (×2): qty 1

## 2020-12-02 MED ORDER — SODIUM CHLORIDE 0.9 % IV SOLN
8.0000 mg | Freq: Once | INTRAVENOUS | Status: AC
  Administered 2020-12-02: 8 mg via INTRAVENOUS
  Filled 2020-12-02: qty 4

## 2020-12-02 MED ORDER — SODIUM CHLORIDE 0.9 % IV SOLN
8.0000 mg | Freq: Four times a day (QID) | INTRAVENOUS | Status: DC
  Administered 2020-12-02 – 2020-12-03 (×4): 8 mg via INTRAVENOUS
  Filled 2020-12-02 (×5): qty 4

## 2020-12-02 MED ORDER — ENSURE ENLIVE PO LIQD
237.0000 mL | Freq: Two times a day (BID) | ORAL | Status: DC
  Administered 2020-12-03: 237 mL via ORAL
  Filled 2020-12-02 (×2): qty 237

## 2020-12-02 MED ORDER — SODIUM CHLORIDE 0.9 % IV SOLN
8.0000 mg | Freq: Four times a day (QID) | INTRAVENOUS | Status: DC
  Filled 2020-12-02 (×6): qty 4

## 2020-12-02 MED ORDER — INFLUENZA VAC SPLIT QUAD 0.5 ML IM SUSY: 0.5000 mL | PREFILLED_SYRINGE | INTRAMUSCULAR | Status: DC

## 2020-12-02 MED ORDER — LACTATED RINGERS IV SOLN
INTRAVENOUS | Status: DC
  Administered 2020-12-03: 125 mL/h via INTRAVENOUS

## 2020-12-02 MED ORDER — FAMOTIDINE IN NACL 20-0.9 MG/50ML-% IV SOLN
20.0000 mg | Freq: Once | INTRAVENOUS | Status: AC
  Administered 2020-12-02: 20 mg via INTRAVENOUS
  Filled 2020-12-02: qty 50

## 2020-12-02 MED ORDER — CALCIUM CARBONATE ANTACID 500 MG PO CHEW: 2.0000 | CHEWABLE_TABLET | ORAL | Status: DC | PRN

## 2020-12-02 MED ORDER — LACTATED RINGERS IV BOLUS
1000.0000 mL | Freq: Once | INTRAVENOUS | Status: AC
  Administered 2020-12-02: 1000 mL via INTRAVENOUS

## 2020-12-02 MED ORDER — ZOLPIDEM TARTRATE 5 MG PO TABS: 5.0000 mg | ORAL_TABLET | Freq: Every evening | ORAL | Status: DC | PRN

## 2020-12-02 MED ORDER — PRENATAL MULTIVITAMIN CH: 1.0000 | ORAL_TABLET | Freq: Every day | ORAL | Status: DC

## 2020-12-02 NOTE — Consult Note (Signed)
[facility] Consult: 2:06 PM 12/02/2020  LOS: 0 days    Referring Provider: Dr Alysia Penna MD Primary Care Physician:  Patient, No Pcp Per Milinda Cave, CNM at [facility] Primary Gastroenterologist:  unassigned    Reason for Consultation: Nausea, vomiting   HPI: Stacie Simpson is a 24 y.o. female.  PMH obesity.  G2P1 pregnancy-induced hypertension.  Iron deficiency anemia during pregnancy in 2015.  GERD.  Depression/anxiety.    Has been vaccinated for COVID-19.    [redacted] weeks gestation.   For the first trimester she had frequent nausea but infrequent vomiting, she did not seek medication or medical attention for this.  For approximately 2 weeks she was nausea, emesis prepancreatic developed flulike symptoms and the nausea and vomiting recurred. She tested Covid 19 + 12/12 and treated w MABs 12/13.  CXR - 11/23/2020 unremarkable.  Is officially off contact precautions.  Initially her husband tested negative for Covid but a few days later was positive and he was also treated with MABs.  Overall respiratory status has improved.  However she says she is having culture increased difficulty breathing when she lays flat.  Because she is unable to keep much down including liquids, she has become oliguric and urine is a deep color.  Weight has gone down 12 in 3 weeks she is in the maternity assessment unit.  A new issue is pressure-like discomfort across her upper abdomen.  This was not present during the first trimester.  Pain does not radiate into posterior thorax but she does have low back pain.  Baseline GI function bowel movements about twice weekly.  Periodic heartburn, indigestion.  She does not like to take meds so she does not treat the heartburn. She uses Colace as needed.    BP 109/78.  EAN 30-year heart rate  96.  Temperature 99.2.  RA sats99%. Sodium 134.  Potassium 3.1.  BUN/creatinine normal.  T bili 1.  Alkaline phosphatase 45.  AST/ALT 57/99.  Lipase 68.  Her LFTs were normal 11 days ago. WBCs 8.  Hb 11.9.  MCV 88.  Platelets 249. Urinalysis with positive ketones but no evidence of UTI Abdominal ultrasound: Normal.  CBD 1.9 mm.  No gallbladder stones or wall thickening.  Liver normal.  PV flow normal.  Visualized portions of pancreas unremarkable.  Family history positive for heart disease, hypertension, DM and oral cancer. Patient is unaware of any problems of the gallbladder liver, pancreas and her family. Has not been consuming alcoholic beverages. She works at a [employer].     Past Medical History:  Diagnosis Date  . Anemia   . Anxiety   . Depression   . GERD (gastroesophageal reflux disease)   . Obesity   . Pregnancy induced hypertension   . TMJ (temporomandibular joint disorder)   . Vision abnormalities    Pt wears contacts    Past Surgical History:  Procedure Laterality Date  . WISDOM TOOTH EXTRACTION  2013    Prior to Admission medications   Medication Sig Start Date End Date Taking? Authorizing Provider  acetaminophen (TYLENOL) 500 MG tablet Take 500 mg by mouth every 6 (six) hours as needed.    [provider]  fluticasone (FLONASE) 50 MCG/ACT nasal spray Place 1-2 sprays into both nostrils daily as needed for allergies or rhinitis.    [provider]  loratadine (CLARITIN) 10 MG tablet Take 10 mg by mouth daily as needed for allergies.    [provider]  Prenatal Vit-Fe Fumarate-FA (PRENATAL MULTIVITAMIN) TABS tablet Take 1 tablet by mouth daily at 12 noon.    [provider]    Scheduled Meds:  Infusions:  PRN Meds:    Allergies as of 12/02/2020  . (No Known Allergies)    Family History  Problem Relation Age of Onset  . Hypertension Father   . Hypertension Maternal Grandmother   . Hypertension Maternal  Grandfather   . Diabetes Maternal Grandfather   . Heart disease Maternal Grandfather   . Hypertension Paternal Grandmother   . Hypertension Paternal Grandfather   . Cancer Paternal Grandfather        tongue  . Heart disease Paternal Grandfather     Social History   Socioeconomic History  . Marital status: Married    Spouse name: Not on file  . Number of children: Not on file  . Years of education: Not on file  . Highest education level: Not on file  Occupational History  . 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 Smoking status: Never Smoker  . Smokeless tobacco: Not on file  Substance and Sexual Activity  . Alcohol use: Yes    Comment: Socially/wine or beer maybe once per month  . Drug use: Yes    Types: Marijuana    Comment: Stopped in May 2013  . Sexual activity: Yes    Partners: Female, Female    Birth control/protection: Condom, Injection  Other Topics Concern  . Not on file  Social History Narrative  . Not on file   Social Determinants of Health   Financial Resource Strain: Not on file  Food Insecurity: Not on file  Transportation Needs: Not on file  Physical Activity: Not on file  Stress: Not on file  Social Connections: Not on file  Intimate Partner Violence: Not on file    REVIEW OF SYSTEMS: Constitutional: Feels tired, weak ENT:  No nose bleeds Pulm: Still has some cough but this and shortness of breath have improved. CV:  No palpitations, no LE edema.  Angina GU:  No hematuria, no frequency GI: See HPI Heme: No unusual or excessive bleeding or bruising. Transfusions: None Neuro:  No headaches, no peripheral tingling or numbness Derm:  No itching, no rash or sores.  Endocrine:  No sweats or chills.  No polyuria or dysuria Immunization: See HPI for details regarding COVID-19 vaccination     PHYSICAL EXAM: Vital signs in last 24 hours: Vitals:   12/02/20 1058  BP: 109/78  Pulse: 96  Resp: 19  Temp: 98.2 F  (36.8 C)  SpO2: 99%   Wt Readings from Last 3 Encounters:  12/02/20 115.9 kg  11/22/20 119.6 kg  04/22/20 123.8 kg    General: Obese, pleasant, well-appearing.  Currently comfortable.  Good historian. Head: No facial asymmetry or swelling.  No signs of head trauma. Eyes: Scleral icterus or conjunctival pallor Ears: Not hard of hearing Nose: No congestion no discharge Mouth: Good dentition.  Oral mucosa is moist, pink, clear. Neck: No JVD, no masses, no thyromegaly Lungs:  Clear bilaterally.  No labored breathing.  No cough Heart: RRR.  No MRG.  S1, S2 present Abdomen: Soft, not distended.  Active bowel sounds.  No HSM, masses, bruits, hernias.  Minimal tenderness in the epigastrium and upper abdomen..   Rectal: Deferred Musc/Skeltl: No joint redness, swelling or gross deformity Extremities: No CCE Neurologic: Alert.  Oriented x3.  Good historian.  Moves all 4 limbs without tremor, strength not tested. Skin: No jaundice.  No suspicious lesions, no rashes, no sores Nodes: No cervical adenopathy Psych: Calm, fluid speech, cooperative, pleasant, engaged.  Not depressed or anxious.  Intake/Output from previous day: No intake/output data recorded. Intake/Output this shift: No intake/output data recorded.  LAB RESULTS: Recent Labs    12/02/20 1112  WBC 8.0  HGB 11.9*  HCT 37.2  PLT 249   BMET Lab Results  Component Value Date   NA 134 (L) 12/02/2020   NA 133 (L) 11/22/2020   NA 136 (L) 10/21/2014   K 3.1 (L) 12/02/2020   K 4.0 11/22/2020   K 3.4 (L) 10/21/2014   CL 103 12/02/2020   CL 105 11/22/2020   CL 102 10/21/2014   CO2 21 (L) 12/02/2020   CO2 17 (L) 11/22/2020   CO2 19 10/21/2014   GLUCOSE 91 12/02/2020   GLUCOSE 102 (H) 11/22/2020   GLUCOSE 113 (H) 10/21/2014   BUN <5 (L) 12/02/2020   BUN <5 (L) 11/22/2020   BUN 7 10/21/2014   CREATININE 0.64 12/02/2020   CREATININE 0.70 11/22/2020   CREATININE 0.59 10/21/2014   CALCIUM 9.3 12/02/2020   CALCIUM 9.2  11/22/2020   CALCIUM 8.5 10/21/2014   LFT Recent Labs    12/02/20 1112  PROT 7.4  ALBUMIN 3.5  AST 57*  ALT 99*  ALKPHOS 45  BILITOT 1.0   PT/INR No results found for: INR, PROTIME Hepatitis Panel No results for input(s): HEPBSAG, HCVAB, HEPAIGM, HEPBIGM in the last 72 hours. C-Diff No components found for: CDIFF Lipase     Component Value Date/Time   LIPASE 68 (H) 12/02/2020 1112    Drugs of Abuse     Component Value Date/Time   LABOPIA NEGATIVE 06/21/2012 1555   LABOPIA NONE DETECTED 06/20/2012 1619   COCAINSCRNUR NEGATIVE 06/21/2012 1555   LABBENZ NEGATIVE 06/21/2012 1555   AMPHETMU NEGATIVE 06/21/2012 1555   AMPHETMU NONE DETECTED 06/20/2012 1619   THCU POSITIVE (A) 06/20/2012 1619   LABBARB NONE DETECTED 06/20/2012 1619     RADIOLOGY STUDIES: US Abdomen Complete  Result Date: 12/02/2020 CLINICAL DATA:  Pregnant patient with nausea and vomiting. Previous coronavirus infection. EXAM: ABDOMEN ULTRASOUND COMPLETE COMPARISON:  None. FINDINGS: Gallbladder: No gallstones or wall thickening visualized. No sonographic Murphy sign noted by sonographer. Common bile duct: Diameter: 1.9 mm common normal. Liver: No focal lesion identified. Within normal limits in parenchymal echogenicity. Portal vein is patent on color Doppler imaging with normal direction of blood flow towards the liver. IVC: No abnormality visualized. Pancreas: Visualized portion unremarkable. Spleen: Size and appearance within normal limits. Right Kidney: Length: 12.9 cm. Echogenicity within normal limits. No mass or hydronephrosis visualized. Left Kidney: Length: 12.6 cm. Echogenicity within normal limits. No mass or hydronephrosis visualized. Abdominal aorta: No aneurysm visualized. Other findings: None. IMPRESSION: Normal abdominal ultrasound. Electronically Signed   By: Paulina Fusi M.D.   On: 12/02/2020 13:51      IMPRESSION:   *    Abdominal pain, nausea, vomiting.  Poor p.o. intake resulting in 13  pound weight loss in pt [redacted]  weeks gestation.  Had nausea, limited emesis  Elevated transaminases and minimally elevated lipase.  Ultrasound has no gallbladder, liver, biliary tree or pancreatic abnormalities.  *    Gestation week 15.  *   COVID-19 +12/12.  Symptomatic with fever, malaise, nausea vomiting. Treated with MABs and now off contact precautions.  *   Hyponatremia.      PLAN:     *   Pepcid, zofran and phenergan scheduled. Continue LR Check acute hep serologies.  Continue LR at 100/hour.    Repeat CMET in AM.   Diet clears as tolerated.    Jennye Moccasin  12/02/2020, 2:06 PM Phone [phone]

## 2020-12-02 NOTE — MAU Provider Note (Signed)
History     CSN: [id number]  Arrival date and time: 12/02/20 1032   Event Date/Time   First Provider Initiated Contact with Patient 12/02/20 1143      Chief Complaint  Patient presents with  . Emesis   HPI   [name] is a 24 y.o. female G2P1001 @ [redacted]w[redacted]d here in MAU with N/V and upper abdominal pain. She reports Testing positive for covid 19 on 12/12, she is vaccinated.  She was seen in MAU on 12/12 and discharged home. She received MAB infusion on 12/13.  She reports having nausea and vomiting in first trimester then resolved. Once she was diagnosed with Covid the symptoms of N/V worsened.  She reports 12 lb weight loss in 3 weeks. She is not table to keep anything down. She has not tried anything for the symptoms.   Reports upper abdominal pain. The pain is all across her upper belly. She reports pain as a 7/10. The pain is constant. Eating does not make the pain worse however she hasn't been eating.   OB History    Gravida  2   Para  1   Term  1   Preterm      AB      Living  1     SAB      IAB      Ectopic      Multiple  0   Live Births  1           Past Medical History:  Diagnosis Date  . Anemia   . Anxiety   . Depression   . GERD (gastroesophageal reflux disease)   . Obesity   . Pregnancy induced hypertension   . TMJ (temporomandibular joint disorder)   . Vision abnormalities    Pt wears contacts    Past Surgical History:  Procedure Laterality Date  . WISDOM TOOTH EXTRACTION  2013    Family History  Problem Relation Age of Onset  . Hypertension Father   . Hypertension Maternal Grandmother   . Hypertension Maternal Grandfather   . Diabetes Maternal Grandfather   . Heart disease Maternal Grandfather   . Hypertension Paternal Grandmother   . Hypertension Paternal Grandfather   . Cancer Paternal Grandfather        tongue  . Heart disease Paternal Grandfather     Social History   Tobacco Use  . Smoking status: Never Smoker   Substance Use Topics  . Alcohol use: Yes    Comment: Socially/wine or beer maybe once per month  . Drug use: Yes    Types: Marijuana    Comment: Stopped in May 2013    Allergies: No Known Allergies  Medications Prior to Admission  Medication Sig Dispense Refill Last Dose  . acetaminophen (TYLENOL) 500 MG tablet Take 500 mg by mouth every 6 (six) hours as needed.     . fluticasone (FLONASE) 50 MCG/ACT nasal spray Place 1-2 sprays into both nostrils daily as needed for allergies or rhinitis.     Marland Kitchen loratadine (CLARITIN) 10 MG tablet Take 10 mg by mouth daily as needed for allergies.     . Prenatal Vit-Fe Fumarate-FA (PRENATAL MULTIVITAMIN) TABS tablet Take 1 tablet by mouth daily at 12 noon.      Results for orders placed or performed during the hospital encounter of 12/02/20 (from the past 48 hour(s))  Amylase     Status: None   Collection Time: 12/02/20 11:12 AM  Result Value Ref Range  Amylase 96 28 - 100 U/L    Comment: Performed at [facility], [address], [place], [place] [address]  Lipase, blood     Status: Abnormal   Collection Time: 12/02/20 11:12 AM  Result Value Ref Range   Lipase 68 (H) 11 - 51 U/L    Comment: Performed at [facility], [address], [place], [place] [address]  CBC with Differential/Platelet     Status: Abnormal   Collection Time: 12/02/20 11:12 AM  Result Value Ref Range   WBC 8.0 4.0 - 10.5 K/uL   RBC 4.20 3.87 - 5.11 MIL/uL   Hemoglobin 11.9 (L) 12.0 - 15.0 g/dL   HCT 69.4 85.4 - 62.7 %   MCV 88.6 80.0 - 100.0 fL   MCH 28.3 26.0 - 34.0 pg   MCHC 32.0 30.0 - 36.0 g/dL   RDW 03.5 00.9 - 38.1 %   Platelets 249 150 - 400 K/uL   nRBC 0.0 0.0 - 0.2 %   Neutrophils Relative % 72 %   Neutro Abs 5.8 1.7 - 7.7 K/uL   Lymphocytes Relative 22 %   Lymphs Abs 1.8 0.7 - 4.0 K/uL   Monocytes Relative 6 %   Monocytes Absolute 0.5 0.1 - 1.0 K/uL   Eosinophils Relative 0 %   Eosinophils Absolute 0.0 0.0 - 0.5 K/uL   Basophils Relative 0  %   Basophils Absolute 0.0 0.0 - 0.1 K/uL   Immature Granulocytes 0 %   Abs Immature Granulocytes 0.03 0.00 - 0.07 K/uL    Comment: Performed at [facility], [address] [place], [place] [address]  Comprehensive metabolic panel     Status: Abnormal   Collection Time: 12/02/20 11:12 AM  Result Value Ref Range   Sodium 134 (L) 135 - 145 mmol/L   Potassium 3.1 (L) 3.5 - 5.1 mmol/L   Chloride 103 98 - 111 mmol/L   CO2 21 (L) 22 - 32 mmol/L   Glucose, Bld 91 70 - 99 mg/dL    Comment: Glucose reference range applies only to samples taken after fasting for at least 8 hours.   BUN <5 (L) 6 - 20 mg/dL   Creatinine, Ser 7.16 0.44 - 1.00 mg/dL   Calcium 9.3 8.9 - 96.7 mg/dL   Total Protein 7.4 6.5 - 8.1 g/dL   Albumin 3.5 3.5 - 5.0 g/dL   AST 57 (H) 15 - 41 U/L   ALT 99 (H) 0 - 44 U/L   Alkaline Phosphatase 45 38 - 126 U/L   Total Bilirubin 1.0 0.3 - 1.2 mg/dL   GFR, Estimated >89 >38 mL/min    Comment: (NOTE) Calculated using the CKD-EPI Creatinine Equation (2021)    Anion gap 10 5 - 15    Comment: Performed at [facility], [address] [place], [place] [address]  Type and screen [facility]     Status: None   Collection Time: 12/02/20 11:12 AM  Result Value Ref Range   ABO/RH(D) O POS    Antibody Screen NEG    Sample Expiration      12/05/2020,2359 Performed at [facility], [address] [place], [place] [address]   Urinalysis, Routine w reflex microscopic Urine, Clean Catch     Status: Abnormal   Collection Time: 12/02/20 12:45 PM  Result Value Ref Range   Color, Urine YELLOW YELLOW   APPearance CLEAR CLEAR   Specific Gravity, Urine 1.008 1.005 -  1.030   pH 6.0 5.0 - 8.0   Glucose, UA NEGATIVE NEGATIVE mg/dL   Hgb urine dipstick NEGATIVE NEGATIVE   Bilirubin Urine NEGATIVE NEGATIVE   Ketones, ur 20 (A) NEGATIVE mg/dL   Protein, ur NEGATIVE NEGATIVE mg/dL   Nitrite NEGATIVE NEGATIVE   Leukocytes,Ua NEGATIVE NEGATIVE    Comment:  Performed at [facility], [address], [place], [place] [address]   US Abdomen Complete  Result Date: 12/02/2020 CLINICAL DATA:  Pregnant patient with nausea and vomiting. Previous coronavirus infection. EXAM: ABDOMEN ULTRASOUND COMPLETE COMPARISON:  None. FINDINGS: Gallbladder: No gallstones or wall thickening visualized. No sonographic Murphy sign noted by sonographer. Common bile duct: Diameter: 1.9 mm common normal. Liver: No focal lesion identified. Within normal limits in parenchymal echogenicity. Portal vein is patent on color Doppler imaging with normal direction of blood flow towards the liver. IVC: No abnormality visualized. Pancreas: Visualized portion unremarkable. Spleen: Size and appearance within normal limits. Right Kidney: Length: 12.9 cm. Echogenicity within normal limits. No mass or hydronephrosis visualized. Left Kidney: Length: 12.6 cm. Echogenicity within normal limits. No mass or hydronephrosis visualized. Abdominal aorta: No aneurysm visualized. Other findings: None. IMPRESSION: Normal abdominal ultrasound. Electronically Signed   By: Paulina Fusi M.D.   On: 12/02/2020 13:51   Review of Systems  Respiratory: Positive for cough.   Gastrointestinal: Positive for abdominal pain, nausea and vomiting.  Genitourinary: Negative for vaginal bleeding and vaginal discharge.  Neurological: Positive for headaches.   Physical Exam   Blood pressure 109/78, pulse 96, temperature 98.2 F (36.8 C), resp. rate 19, weight 115.9 kg, last menstrual period 04/19/2020, SpO2 99 %, unknown if currently breastfeeding.  Physical Exam Vitals and nursing note reviewed.  Constitutional:      General: She is not in acute distress.    Appearance: Normal appearance. She is not ill-appearing, toxic-appearing or diaphoretic.  Abdominal:     Palpations: Abdomen is soft. There is no hepatomegaly or splenomegaly.     Tenderness: There is abdominal tenderness in the right upper quadrant,  epigastric area and left upper quadrant. Negative signs include Murphy's sign.  Neurological:     Mental Status: She is alert.    MAU Course  Procedures  None  MDM  + fetal heart tones via doppler Urine shows dehydration LR bolus, Pepcid, Zofran & Tylenol given Consulted with GI who came to MAU to see the patient Discussed patient with Dr. Dollene Primrose and recommended admission. Discussed plan of care with the patient.   Assessment and Plan   A:  1. Elevated liver enzymes   2. Weight loss   3. Nausea and vomiting in pregnancy   4. Elevated lipase   5. Upper abdominal pain     P:  Admit to [facility] GI for diet and medication management Dr. Anastasio Champion in MAU assessing patient.  Duane Lope, NP 12/02/2020 4:45 PM

## 2020-12-02 NOTE — MAU Note (Signed)
Patient sent over for dehydration, dx w/ COVID on 12/12.  Patient continues to have N/V and abdominal cramping.  Denies any VB.

## 2020-12-02 NOTE — MAU Note (Signed)
Patient unable to void at this time

## 2020-12-02 NOTE — H&P (Signed)
Stacie Simpson is a 24 y.o. G2P1001 at [redacted]w[redacted]d gestation presents for complaint of nausea/vomiting for 3 weeks. Mild nausea earlier in pregnancy, covid + 11/22/20 and then n/v aggravated since then. Her covid sx have resolved except a lingering cough (neg CXR 12/13).  Today she does c/o RUQ pain off/on over last couple days. She denies lof/vb/ctx.  Antepartum course: covid positive in pregnancy, n/v; h/o pre-eclampsia in prior pregnancy  [facility] at [facility] since 10 wks.  See complete pre-natal records  History OB History    Gravida  2   Para  1   Term  1   Preterm      AB      Living  1     SAB      IAB      Ectopic      Multiple  0   Live Births  1          Past Medical History:  Diagnosis Date  . Anemia   . Anxiety   . Depression   . GERD (gastroesophageal reflux disease)   . Obesity   . Pregnancy induced hypertension   . TMJ (temporomandibular joint disorder)   . Vision abnormalities    Pt wears contacts   Past Surgical History:  Procedure Laterality Date  . WISDOM TOOTH EXTRACTION  2013   Family History: family history includes Cancer in her paternal grandfather; Diabetes in her maternal grandfather; Heart disease in her maternal grandfather and paternal grandfather; Hypertension in her father, maternal grandfather, maternal grandmother, paternal grandfather, and paternal grandmother. Social History:  reports that she has never smoked. She does not have any smokeless tobacco history on file. She reports current alcohol use. She reports current drug use. Drug: Marijuana.  ROS: See above otherwise negative  Prenatal labs:  ABO, Rh: --/--/O POS (12/22 1112) Antibody: NEG (12/22 1112) Rubella:  immune RPR:   nr HBsAg:   nr HIV:  nr Genetic screening: Normal   Physical Exam:     Blood pressure 126/61, pulse (!) 101, temperature 98.2 F (36.8 C), resp. rate 19, weight 115.9 kg, last menstrual period 04/19/2020, SpO2 99 %, unknown if  currently breastfeeding. A&O x 3 HEENT: Normal Lungs: CTAB CV: RRR Abdominal: Soft and Non-tender Lower Extremities: Non-edematous, Non-tender  Labs:  Results for orders placed or performed during the hospital encounter of 12/02/20 (from the past 24 hour(s))  Amylase     Status: None   Collection Time: 12/02/20 11:12 AM  Result Value Ref Range   Amylase 96 28 - 100 U/L  Lipase, blood     Status: Abnormal   Collection Time: 12/02/20 11:12 AM  Result Value Ref Range   Lipase 68 (H) 11 - 51 U/L  CBC with Differential/Platelet     Status: Abnormal   Collection Time: 12/02/20 11:12 AM  Result Value Ref Range   WBC 8.0 4.0 - 10.5 K/uL   RBC 4.20 3.87 - 5.11 MIL/uL   Hemoglobin 11.9 (L) 12.0 - 15.0 g/dL   HCT 16.1 09.6 - 04.5 %   MCV 88.6 80.0 - 100.0 fL   MCH 28.3 26.0 - 34.0 pg   MCHC 32.0 30.0 - 36.0 g/dL   RDW 40.9 81.1 - 91.4 %   Platelets 249 150 - 400 K/uL   nRBC 0.0 0.0 - 0.2 %   Neutrophils Relative % 72 %   Neutro Abs 5.8 1.7 - 7.7 K/uL   Lymphocytes Relative 22 %   Lymphs Abs 1.8  0.7 - 4.0 K/uL   Monocytes Relative 6 %   Monocytes Absolute 0.5 0.1 - 1.0 K/uL   Eosinophils Relative 0 %   Eosinophils Absolute 0.0 0.0 - 0.5 K/uL   Basophils Relative 0 %   Basophils Absolute 0.0 0.0 - 0.1 K/uL   Immature Granulocytes 0 %   Abs Immature Granulocytes 0.03 0.00 - 0.07 K/uL  Comprehensive metabolic panel     Status: Abnormal   Collection Time: 12/02/20 11:12 AM  Result Value Ref Range   Sodium 134 (L) 135 - 145 mmol/L   Potassium 3.1 (L) 3.5 - 5.1 mmol/L   Chloride 103 98 - 111 mmol/L   CO2 21 (L) 22 - 32 mmol/L   Glucose, Bld 91 70 - 99 mg/dL   BUN <5 (L) 6 - 20 mg/dL   Creatinine, Ser 7.61 0.44 - 1.00 mg/dL   Calcium 9.3 8.9 - 95.0 mg/dL   Total Protein 7.4 6.5 - 8.1 g/dL   Albumin 3.5 3.5 - 5.0 g/dL   AST 57 (H) 15 - 41 U/L   ALT 99 (H) 0 - 44 U/L   Alkaline Phosphatase 45 38 - 126 U/L   Total Bilirubin 1.0 0.3 - 1.2 mg/dL   GFR, Estimated >93 >26 mL/min    Anion gap 10 5 - 15  Type and screen [facility]     Status: None   Collection Time: 12/02/20 11:12 AM  Result Value Ref Range   ABO/RH(D) O POS    Antibody Screen NEG    Sample Expiration      12/05/2020,2359 Performed at [facility], [address], [place], [place] [address]   Urinalysis, Routine w reflex microscopic Urine, Clean Catch     Status: Abnormal   Collection Time: 12/02/20 12:45 PM  Result Value Ref Range   Color, Urine YELLOW YELLOW   APPearance CLEAR CLEAR   Specific Gravity, Urine 1.008 1.005 - 1.030   pH 6.0 5.0 - 8.0   Glucose, UA NEGATIVE NEGATIVE mg/dL   Hgb urine dipstick NEGATIVE NEGATIVE   Bilirubin Urine NEGATIVE NEGATIVE   Ketones, ur 20 (A) NEGATIVE mg/dL   Protein, ur NEGATIVE NEGATIVE mg/dL   Nitrite NEGATIVE NEGATIVE   Leukocytes,Ua NEGATIVE NEGATIVE     Assessment/Plan:  24 y.o. G2P1001 at [redacted]w[redacted]d gestation   1. Dehydration d/t n/v pregnancy, likely exacerbated during covid infection - admit for management - plan pepcid, scheduled zofran and prn phenergan; bland diet discussed, grazing; contin IVF and encourage oral intake 2. Elevated lfts, lipase slightly elevated - s/p GI consult in MAU; normal gallbladder/common bile duct/liver/pancreas on u/s today; Hepatitis panel pending; abnml labs suspected d/t pt's dehydration, would anticipate improvement with improved hydration, plan repeat labs in am 3. Fetal status - fht q day 4. H/o covid in pregnancy, sx minimal with lingering cough occasionally  5. H/o pre-eclampsia in prior pregnancy  Vick Frees 12/02/2020, 4:00 PM

## 2020-12-03 DIAGNOSIS — O211 Hyperemesis gravidarum with metabolic disturbance: Secondary | ICD-10-CM | POA: Diagnosis not present

## 2020-12-03 DIAGNOSIS — O219 Vomiting of pregnancy, unspecified: Secondary | ICD-10-CM | POA: Diagnosis not present

## 2020-12-03 DIAGNOSIS — R748 Abnormal levels of other serum enzymes: Secondary | ICD-10-CM | POA: Diagnosis not present

## 2020-12-03 LAB — COMPREHENSIVE METABOLIC PANEL
ALT: 71 U/L — ABNORMAL HIGH (ref 0–44)
AST: 39 U/L (ref 15–41)
Albumin: 2.8 g/dL — ABNORMAL LOW (ref 3.5–5.0)
Alkaline Phosphatase: 37 U/L — ABNORMAL LOW (ref 38–126)
Anion gap: 9 (ref 5–15)
BUN: <5 mg/dL — ABNORMAL LOW (ref 6–20)
CO2: 21 mmol/L — ABNORMAL LOW (ref 22–32)
Calcium: 8.6 mg/dL — ABNORMAL LOW (ref 8.9–10.3)
Chloride: 105 mmol/L (ref 98–111)
Creatinine, Ser: 0.65 mg/dL (ref 0.44–1.00)
GFR, Estimated: >60 mL/min (ref >60)
Glucose, Bld: 85 mg/dL (ref 70–99)
Potassium: 3.5 mmol/L (ref 3.5–5.1)
Sodium: 135 mmol/L (ref 135–145)
Total Bilirubin: 0.8 mg/dL (ref 0.3–1.2)
Total Protein: 5.9 g/dL — ABNORMAL LOW (ref 6.5–8.1)

## 2020-12-03 LAB — LIPASE, BLOOD: Lipase: 82 U/L — ABNORMAL HIGH (ref 11–51)

## 2020-12-03 MED ORDER — ONDANSETRON 8 MG PO TBDP: 8.0000 mg | ORAL_TABLET | Freq: Three times a day (TID) | ORAL | 0 refills | Status: DC | PRN

## 2020-12-03 MED ORDER — FAMOTIDINE 20 MG PO TABS: 20.0000 mg | ORAL_TABLET | Freq: Two times a day (BID) | ORAL | 1 refills | Status: DC

## 2020-12-03 NOTE — Plan of Care (Signed)
Pt to be discharged home with printed instructions. Carder Yin L Niquan Charnley, RN  

## 2020-12-03 NOTE — Plan of Care (Signed)
  Problem: Activity: Goal: Risk for activity intolerance will decrease Outcome: Completed/Met   Problem: Coping: Goal: Level of anxiety will decrease Outcome: Completed/Met   Problem: Education: Goal: Knowledge of disease or condition will improve Outcome: Completed/Met   Problem: Coping: Goal: Level of anxiety will decrease Outcome: Completed/Met

## 2020-12-03 NOTE — Progress Notes (Signed)
Progress Note   Subjective  Patient feeling much better this AM following IVF and antiemetics. Tolerating diet, states Zofran has helped   Objective   Vital signs in last 24 hours: Temp:  [98.2 F (36.8 C)-99.1 F (37.3 C)] 98.8 F (37.1 C) (12/23 1140) Pulse Rate:  [78-101] 89 (12/23 1140) Resp:  [16-20] 18 (12/23 1140) BP: (95-126)/(47-65) 95/48 (12/23 1140) SpO2:  [96 %-100 %] 100 % (12/23 1140) Weight:  [220 kg] 117 kg (12/23 0500)   General:    AA female in NAD Extremities:  Without edema. Neurologic:  Alert and oriented,  grossly normal neurologically. Psych:  Cooperative. Normal mood and affect.  Intake/Output from previous day: 12/22 0701 - 12/23 0700 In: 2993.7 [P.O.:1200; I.V.:1685.7; IV Piggyback:108] Out: 1750 [Urine:1750] Intake/Output this shift: Total I/O In: 553 [P.O.:553] Out: 900 [Urine:900]  Lab Results: Recent Labs    12/02/20 1112  WBC 8.0  HGB 11.9*  HCT 37.2  PLT 249   BMET Recent Labs    12/02/20 1112 12/03/20 0533  NA 134* 135  K 3.1* 3.5  CL 103 105  CO2 21* 21*  GLUCOSE 91 85  BUN <5* <5*  CREATININE 0.64 0.65  CALCIUM 9.3 8.6*   LFT Recent Labs    12/03/20 0533  PROT 5.9*  ALBUMIN 2.8*  AST 39  ALT 71*  ALKPHOS 37*  BILITOT 0.8   PT/INR No results for input(s): LABPROT, INR in the last 72 hours.  Studies/Results: US Abdomen Complete  Result Date: 12/02/2020 CLINICAL DATA:  Pregnant patient with nausea and vomiting. Previous coronavirus infection. EXAM: ABDOMEN ULTRASOUND COMPLETE COMPARISON:  None. FINDINGS: Gallbladder: No gallstones or wall thickening visualized. No sonographic Murphy sign noted by sonographer. Common bile duct: Diameter: 1.9 mm common normal. Liver: No focal lesion identified. Within normal limits in parenchymal echogenicity. Portal vein is patent on color Doppler imaging with normal direction of blood flow towards the liver. IVC: No abnormality visualized. Pancreas: Visualized portion  unremarkable. Spleen: Size and appearance within normal limits. Right Kidney: Length: 12.9 cm. Echogenicity within normal limits. No mass or hydronephrosis visualized. Left Kidney: Length: 12.6 cm. Echogenicity within normal limits. No mass or hydronephrosis visualized. Abdominal aorta: No aneurysm visualized. Other findings: None. IMPRESSION: Normal abdominal ultrasound. Electronically Signed   By: Paulina Fusi M.D.   On: 12/02/2020 13:51       Assessment / Plan:    24 y/o female with second pregnancy, [redacted] weeks gestation. Admitted with worsening nausea / vomiting and mild elevation in liver enzymes / lipase. RUQ US showed no gallstones and a normal liver. Acute hep panel negative. I do not think she has pancreatitis.  Suspect constellation of findings is that of nausea / vomiting of pregnancy. She has done quite well with IVF and antiemetics / pepcid. Tolerating diet. I think okay to go home today.  Discussed options with patient for treatment moving forward. Recommending the following: - Vitamin B6 supplementation daily - Continue pepcid 20mg  once to twice daily as needed - Zofran every 6 hours scheduled initially and then can use PRN if doing okay - liver enzymes improved, I suspect related to nausea / vomiting of pregnancy. Would repeat in 1- 2 weeks and see where this is trending, contact me if rising or persistent. - I provided her my office information, she can call or follow up as needed with me for any persistent symptoms.  Please call with questions, we will sign off for now. Thank you.  Marshville Cellar, MD [facility]

## 2020-12-03 NOTE — Progress Notes (Signed)
No c/o; pt states feeling much better; no emesis since admission; had 50% of her breakfast (included bacon/sausage), also had ensure; no c/o nausea   Patient Vitals for the past 24 hrs:  BP Temp Temp src Pulse Resp SpO2 Height Weight  12/03/20 1140 (!) 95/48 98.8 F (37.1 C) Oral 89 18 100 % -- --  12/03/20 0755 (!) 100/52 98.9 F (37.2 C) Oral 90 20 96 % -- --  12/03/20 0500 -- -- -- -- -- -- -- 117 kg  12/03/20 0426 (!) 96/53 98.7 F (37.1 C) Oral 78 17 98 % -- --  12/02/20 2318 (!) 95/47 99.1 F (37.3 C) Oral 78 16 96 % -- --  12/02/20 1925 (!) 97/56 98.2 F (36.8 C) Oral 81 18 98 % -- --  12/02/20 1646 -- -- -- -- -- -- 5\' 10"  (1.778 m) --  12/02/20 1626 113/65 98.7 F (37.1 C) Oral 98 18 99 % -- --  12/02/20 1543 126/61 -- -- 12/04/20 101 17 -- -- --  12/02/20 1542 -- -- -- -- -- 99 % -- --    A&ox3 Normal respirations Abd: soft, nt LE: no edema, nt bilat  Results for orders placed or performed during the hospital encounter of 12/02/20 (from the past 24 hour(s))  Urinalysis, Routine w reflex microscopic Urine, Clean Catch     Status: Abnormal   Collection Time: 12/02/20 12:45 PM  Result Value Ref Range   Color, Urine YELLOW YELLOW   APPearance CLEAR CLEAR   Specific Gravity, Urine 1.008 1.005 - 1.030   pH 6.0 5.0 - 8.0   Glucose, UA NEGATIVE NEGATIVE mg/dL   Hgb urine dipstick NEGATIVE NEGATIVE   Bilirubin Urine NEGATIVE NEGATIVE   Ketones, ur 20 (A) NEGATIVE mg/dL   Protein, ur NEGATIVE NEGATIVE mg/dL   Nitrite NEGATIVE NEGATIVE   Leukocytes,Ua NEGATIVE NEGATIVE  Comprehensive metabolic panel     Status: Abnormal   Collection Time: 12/03/20  5:33 AM  Result Value Ref Range   Sodium 135 135 - 145 mmol/L   Potassium 3.5 3.5 - 5.1 mmol/L   Chloride 105 98 - 111 mmol/L   CO2 21 (L) 22 - 32 mmol/L   Glucose, Bld 85 70 - 99 mg/dL   BUN <5 (L) 6 - 20 mg/dL   Creatinine, Ser 12/05/20 0.44 - 1.00 mg/dL   Calcium 8.6 (L) 8.9 - 10.3 mg/dL   Total Protein 5.9 (L) 6.5 - 8.1 g/dL    Albumin 2.8 (L) 3.5 - 5.0 g/dL   AST 39 15 - 41 U/L   ALT 71 (H) 0 - 44 U/L   Alkaline Phosphatase 37 (L) 38 - 126 U/L   Total Bilirubin 0.8 0.3 - 1.2 mg/dL   GFR, Estimated 7.34 >19 mL/min   Anion gap 9 5 - 15  Lipase, blood     Status: Abnormal   Collection Time: 12/03/20  5:33 AM  Result Value Ref Range   Lipase 82 (H) 11 - 51 U/L   FHT: 148  A/P: G2P1001 iup at 16wga 1. N/v of pregnancy - patient is much better clinically, lfts improving and while lipase slightly elevated, not worrisome; discussion with Dr 12/05/20 (GI) while in patients rooms and we both agree that patient is doing well and ok for d/c home; I discussed the importance of managing her GERD sx and also nausea to help prevent worsening sx and pt agrees; will contin pepcid, zofran q 6 until tol po for few days then begin prn;  unisom/b6 daily, protein q 2 hrs, good hydration, ginger/mint/lemon, bland diet; inform if not tol po, protein drinks also encouraged; plan office f/u in 4 days, plan repeat labs in 1-2 wks as outpatient; plan GI f/u if labs not improving despite n/v well managed 2. Fetal status reassuring

## 2020-12-12 NOTE — L&D Delivery Note (Addendum)
Delivery Note:   Stacie Simpson G2P1001 at [redacted]w[redacted]d  Admitting diagnosis: Encounter for induction of labor [Z34.90] Risks: LGA- EFW 8# Proven Pelvis up to 8#7oz.  Anemia- s/p IV venofer x2, and oral iron. BMI>40 History of PEC with G1-On BBASA  First Stage:  Induction of labor: 05/13/2021,  initiated with Outpatient FB. Augmentation: AROM, Pitocin, Cytotec and OP Foley ROM: AROM 05/13/21 @ 2245 clear fluid  Active labor onset: 05/13/2021@ 2300 Analgesia /Anesthesia/Pain control intrapartum: Epidural   Second Stage:  Complete dilation at 05/14/2021  0115 Onset of pushing at 0138 FHR second stage Cat II FHT. Baseline of 135, with moderate variability, variables and early decelerations with contractions; quick return to baseline.    Stacie Simpson Pushing in lithotomy position with great maternal efforts. SNM, CNM and L&D staff support encouraging mom. Fetal head crowned up on first contraction. Involuntarily, the following uterine contraction slowly expelled fetal head with ease. Posterior arm spontaneously released and remaining fetal body delivered with maternal efforts by SNM with ease. FOB "Stacie Simpson" and mother of the patient "Stacie Simpson" present for birth and supportive.  Nuchal Cord: No  Delivery of a Live born female  Birth Weight:  8lbs 7.5oz (3841g) APGAR: 9, 9  Newborn Delivery   Birth date/time: 05/14/2021 01:43:00 Delivery type: Vaginal, Spontaneous      Infant delivered in cephalic presentation with a compound posterior hand , in DOA position and restituted to LOA position. Crying infant placed immediately S2S with mom.   Cord double clamped after cessation of pulsation by SNM, cut by FOB "Stacie Simpson".  Collection of cord blood for typing completed. Cord blood donation-None  Arterial cord blood sample-No    Third Stage:  Placenta delivered Spontaneously, intact with 3 vessels . Uterine tone firm bleeding minimal  Uterotonics: IV pitocin  Placenta to L&D for disposal.  None  laceration  identified.  Episiotomy:None  Local analgesia: N/A  Repair:N/A Est. Blood Loss (mL):75.00   Complications: None   Mom to postpartum.  Baby girl "Stacie Simpson" to Couplet care / Skin to Skin.  Delivery Report:  Review the Delivery Report for details.     Signed: Royetta Asal), BSN, RNC-OB, Student Nurse Midwife 05/14/2021, 2:08 AM

## 2021-03-01 ENCOUNTER — Other Ambulatory Visit: Payer: Self-pay | Admitting: Certified Nurse Midwife

## 2021-03-01 DIAGNOSIS — O99013 Anemia complicating pregnancy, third trimester: Secondary | ICD-10-CM

## 2021-03-12 ENCOUNTER — Other Ambulatory Visit: Payer: Self-pay

## 2021-03-12 ENCOUNTER — Encounter (HOSPITAL_COMMUNITY)
Admission: RE | Admit: 2021-03-12 | Discharge: 2021-03-12 | Disposition: A | Discharge status: Home / Self Care | Payer: 59 | Source: Ambulatory Visit | Attending: Certified Nurse Midwife | Admitting: Certified Nurse Midwife

## 2021-03-12 DIAGNOSIS — O99013 Anemia complicating pregnancy, third trimester: Secondary | ICD-10-CM | POA: Diagnosis present

## 2021-03-12 MED ORDER — SODIUM CHLORIDE 0.9 % IV SOLN
500.0000 mg | INTRAVENOUS | Status: DC
  Administered 2021-03-12: 500 mg via INTRAVENOUS
  Filled 2021-03-12: qty 25

## 2021-03-12 MED ORDER — DIPHENHYDRAMINE HCL 25 MG PO CAPS: 25.0000 mg | ORAL_CAPSULE | Freq: Four times a day (QID) | ORAL | Status: DC | PRN

## 2021-03-12 MED ORDER — ACETAMINOPHEN 500 MG PO TABS: 1000.0000 mg | ORAL_TABLET | Freq: Four times a day (QID) | ORAL | Status: DC | PRN

## 2021-03-12 NOTE — Discharge Instructions (Signed)

## 2021-03-19 ENCOUNTER — Other Ambulatory Visit: Payer: Self-pay

## 2021-03-19 ENCOUNTER — Encounter (HOSPITAL_COMMUNITY)
Admission: RE | Admit: 2021-03-19 | Discharge: 2021-03-19 | Disposition: A | Discharge status: Home / Self Care | Payer: 59 | Source: Ambulatory Visit | Attending: Certified Nurse Midwife | Admitting: Certified Nurse Midwife

## 2021-03-19 DIAGNOSIS — O99013 Anemia complicating pregnancy, third trimester: Secondary | ICD-10-CM | POA: Diagnosis not present

## 2021-03-19 MED ORDER — ACETAMINOPHEN 500 MG PO TABS
1000.0000 mg | ORAL_TABLET | Freq: Four times a day (QID) | ORAL | Status: DC | PRN
  Administered 2021-03-19: 1000 mg via ORAL

## 2021-03-19 MED ORDER — ACETAMINOPHEN 500 MG PO TABS
ORAL_TABLET | ORAL | Status: AC
  Filled 2021-03-19: qty 2

## 2021-03-19 MED ORDER — SODIUM CHLORIDE 0.9 % IV SOLN
500.0000 mg | INTRAVENOUS | Status: AC
  Administered 2021-03-19: 500 mg via INTRAVENOUS
  Filled 2021-03-19: qty 25

## 2021-03-19 MED ORDER — DIPHENHYDRAMINE HCL 25 MG PO CAPS
25.0000 mg | ORAL_CAPSULE | Freq: Four times a day (QID) | ORAL | Status: DC | PRN
  Administered 2021-03-19: 25 mg via ORAL

## 2021-03-19 MED ORDER — DIPHENHYDRAMINE HCL 25 MG PO CAPS
ORAL_CAPSULE | ORAL | Status: AC
  Filled 2021-03-19: qty 1

## 2021-04-10 ENCOUNTER — Encounter: Payer: Self-pay | Admitting: *Deleted

## 2021-04-10 ENCOUNTER — Ambulatory Visit
Admission: EM | Admit: 2021-04-10 | Discharge: 2021-04-10 | Disposition: A | Discharge status: Home / Self Care | Payer: 59

## 2021-04-10 ENCOUNTER — Other Ambulatory Visit: Payer: Self-pay

## 2021-04-10 ENCOUNTER — Ambulatory Visit: Payer: Self-pay

## 2021-04-10 DIAGNOSIS — H6123 Impacted cerumen, bilateral: Secondary | ICD-10-CM

## 2021-04-10 NOTE — Discharge Instructions (Addendum)
Rest and drink plenty of fluids Ear lavage performed Continue to use OTC tylenol as needed for pain control Follow up with PCP if symptoms persists Return here or go to the ER if you have any new or worsening symptoms

## 2021-04-10 NOTE — ED Provider Notes (Signed)
[facility]   [id number] 04/10/21 Arrival Time: 1004  CC: EAR clogged  SUBJECTIVE: History from: family.  Stacie SHIFRIN is a 25 y.o. female who presents with of bilateral ear clogged x 3 days.  Patient states she washed her hair a few days ago.  Has been trying to remove ear wax with q-tip.  Denies aggravating factors.  Reports similar symptoms in the past.  Denies fever, chills, fatigue, sinus pain, rhinorrhea, ear discharge, sore throat, SOB, wheezing, chest pain, nausea, changes in bowel or bladder habits.    ROS: As per HPI.  All other pertinent ROS negative.     Past Medical History:  Diagnosis Date  . Anemia   . Anxiety   . Depression   . GERD (gastroesophageal reflux disease)   . Obesity   . Pregnancy induced hypertension   . TMJ (temporomandibular joint disorder)   . Vision abnormalities    Pt wears contacts   Past Surgical History:  Procedure Laterality Date  . PILONIDAL CYST EXCISION    . WISDOM TOOTH EXTRACTION  2013   No Known Allergies No current facility-administered medications on file prior to encounter.   Current Outpatient Medications on File Prior to Encounter  Medication Sig Dispense Refill  . acetaminophen (TYLENOL) 500 MG tablet Take 500 mg by mouth every 6 (six) hours as needed.    . famotidine (PEPCID) 20 MG tablet Take 1 tablet (20 mg total) by mouth 2 (two) times daily. 60 tablet 1  . Ferrous Sulfate (IRON PO) Take by mouth.    . loratadine (CLARITIN) 10 MG tablet Take 10 mg by mouth daily as needed for allergies.    . Prenatal Vit-Fe Fumarate-FA (PRENATAL MULTIVITAMIN) TABS tablet Take 1 tablet by mouth daily at 12 noon.    Marland Kitchen VITAMIN D PO Take by mouth.    . [DISCONTINUED] fluticasone (FLONASE) 50 MCG/ACT nasal spray Place 1-2 sprays into both nostrils daily as needed for allergies or rhinitis.     Social History   Socioeconomic History  . Marital status: Married    Spouse name: Not on file  . Number of children: Not on file  .  Years of education: Not on file  . Highest education level: Not on file  Occupational History  . 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 Smoking status: Never Smoker  . Smokeless tobacco: Never Used  Vaping Use  . Vaping Use: Never used  Substance and Sexual Activity  . Alcohol use: Not Currently  . Drug use: Not Currently    Types: Marijuana  . Sexual activity: Yes    Partners: Female, Female    Comment: pregnant  Other Topics Concern  . Not on file  Social History Narrative  . Not on file   Social Determinants of Health   Financial Resource Strain: Not on file  Food Insecurity: Not on file  Transportation Needs: Not on file  Physical Activity: Not on file  Stress: Not on file  Social Connections: Not on file  Intimate Partner Violence: Not on file   Family History  Problem Relation Age of Onset  . Healthy Mother   . Hypertension Father   . Hypertension Maternal Grandmother   . Hypertension Maternal Grandfather   . Diabetes Maternal Grandfather   . Heart disease Maternal Grandfather   . Hypertension Paternal Grandmother   . Hypertension Paternal Grandfather   . Cancer Paternal Grandfather  tongue  . Heart disease Paternal Grandfather     OBJECTIVE:  Vitals:   04/10/21 1015  BP: 121/80  Pulse: (!) 109  Temp: (!) 97.3 F (36.3 C)  TempSrc: Temporal  SpO2: 98%    General appearance: alert; well-appearing, nontoxic; speaking in full sentences and tolerating own secretions HEENT: NCAT; Ears: EACs obstructed with ear wax; Eyes: PERRL.  EOM grossly intact.Nose: nares patent without rhinorrhea, Throat: oropharynx clear, tonsils non erythematous or enlarged, uvula midline  Neck: supple without LAD Lungs: unlabored respirations, symmetrical air entry; cough: absent; no respiratory distress; CTAB Heart: regular rate and rhythm.  Skin: warm and dry Psychological: alert and cooperative; normal mood and affect    PROCEDURE:  Consent granted.  Bilateral ear lavage performed by RN.  PT tolerated procedure well.     ASSESSMENT & PLAN:  1. Bilateral impacted cerumen    Rest and drink plenty of fluids Ear lavage performed Continue to use OTC tylenol as needed for pain control Follow up with PCP if symptoms persists Return here or go to the ER if you have any new or worsening symptoms   Reviewed expectations re: course of current medical issues. Questions answered. Outlined signs and symptoms indicating need for more acute intervention. Patient verbalized understanding. After Visit Summary given.         Rennis Harding, PA-C 04/10/21 1040

## 2021-04-10 NOTE — ED Triage Notes (Addendum)
C/O right ear feeling plugged with decreased hearing x 3 days; states left ear starting to feel full as well.  C/O slight pain.  Has tried OTC wax drops without relief.

## 2021-04-28 LAB — OB RESULTS CONSOLE GBS: GBS: NEGATIVE

## 2021-05-01 ENCOUNTER — Other Ambulatory Visit: Payer: Self-pay

## 2021-05-01 ENCOUNTER — Inpatient Hospital Stay (HOSPITAL_COMMUNITY)
Admission: AD | Admit: 2021-05-01 | Discharge: 2021-05-02 | Disposition: A | Discharge status: Home / Self Care | Payer: 59 | Attending: Obstetrics and Gynecology | Admitting: Obstetrics and Gynecology

## 2021-05-01 DIAGNOSIS — Z7982 Long term (current) use of aspirin: Secondary | ICD-10-CM | POA: Insufficient documentation

## 2021-05-01 DIAGNOSIS — O368130 Decreased fetal movements, third trimester, not applicable or unspecified: Secondary | ICD-10-CM | POA: Insufficient documentation

## 2021-05-01 DIAGNOSIS — Z79899 Other long term (current) drug therapy: Secondary | ICD-10-CM | POA: Insufficient documentation

## 2021-05-01 DIAGNOSIS — Z3A37 37 weeks gestation of pregnancy: Secondary | ICD-10-CM | POA: Insufficient documentation

## 2021-05-01 DIAGNOSIS — Z3689 Encounter for other specified antenatal screening: Secondary | ICD-10-CM

## 2021-05-01 NOTE — MAU Note (Signed)
..  Stacie Simpson is a 25 y.o. at [redacted]w[redacted]d here in MAU reporting: Decreased fetal movement this evening. She felt some movement on the way to MAU but not like normal. Reports some irregular contractions. Denies vaginal bleeding or LOF.  Pain score: 0/10 Vitals:   05/02/21 0002  BP: 131/76  Pulse: (!) 108  Resp: 17  Temp: 98.7 F (37.1 C)  SpO2: 100%     FHT: monitors applied 135 and clicker given with instructions to click every time she felt some movement.

## 2021-05-02 ENCOUNTER — Encounter (HOSPITAL_COMMUNITY): Payer: Self-pay | Admitting: Obstetrics and Gynecology

## 2021-05-02 DIAGNOSIS — Z3A37 37 weeks gestation of pregnancy: Secondary | ICD-10-CM

## 2021-05-02 DIAGNOSIS — Z7982 Long term (current) use of aspirin: Secondary | ICD-10-CM | POA: Diagnosis not present

## 2021-05-02 DIAGNOSIS — O368130 Decreased fetal movements, third trimester, not applicable or unspecified: Secondary | ICD-10-CM

## 2021-05-02 DIAGNOSIS — Z79899 Other long term (current) drug therapy: Secondary | ICD-10-CM | POA: Diagnosis not present

## 2021-05-02 NOTE — MAU Provider Note (Signed)
History     CSN: [id number]  Arrival date and time: 05/01/21 2342   Event Date/Time   First Provider Initiated Contact with Patient 05/02/21 0019      Chief Complaint  Patient presents with  . Decreased Fetal Movement   Stacie Simpson is a 25 y.o. G2P1001 at [redacted]w[redacted]d who receives care at [facility].  She presents today for Decreased Fetal Movement.  Patient states she was not feeling movement, but has experienced movement since arrival to the MAU.  She endorses abdominal cramping and contractions and requests a cervical exam.  Patient denies vaginal bleeding, but reports increased discharge particularly after taking walks.     OB History    Gravida  2   Para  1   Term  1   Preterm      AB      Living  1     SAB      IAB      Ectopic      Multiple  0   Live Births  1           Past Medical History:  Diagnosis Date  . Anemia   . Anxiety   . Depression   . GERD (gastroesophageal reflux disease)   . Obesity   . Pregnancy induced hypertension   . TMJ (temporomandibular joint disorder)   . Vision abnormalities    Pt wears contacts    Past Surgical History:  Procedure Laterality Date  . PILONIDAL CYST EXCISION    . WISDOM TOOTH EXTRACTION  2013    Family History  Problem Relation Age of Onset  . Healthy Mother   . Hypertension Father   . Hypertension Maternal Grandmother   . Hypertension Maternal Grandfather   . Diabetes Maternal Grandfather   . Heart disease Maternal Grandfather   . Hypertension Paternal Grandmother   . Hypertension Paternal Grandfather   . Cancer Paternal Grandfather        tongue  . Heart disease Paternal Grandfather     Social History   Tobacco Use  . Smoking status: Never Smoker  . Smokeless tobacco: Never Used  Vaping Use  . Vaping Use: Never used  Substance Use Topics  . Alcohol use: Not Currently  . Drug use: Not Currently    Types: Marijuana    Allergies: No Known Allergies  Medications Prior to  Admission  Medication Sig Dispense Refill Last Dose  . acetaminophen (TYLENOL) 500 MG tablet Take 500 mg by mouth every 6 (six) hours as needed.   Past Week at Unknown time  . aspirin 81 MG chewable tablet Chew by mouth daily.   05/01/2021 at Unknown time  . famotidine (PEPCID) 20 MG tablet Take 1 tablet (20 mg total) by mouth 2 (two) times daily. 60 tablet 1 Past Month at Unknown time  . loratadine (CLARITIN) 10 MG tablet Take 10 mg by mouth daily as needed for allergies.   05/01/2021 at Unknown time  . pantoprazole (PROTONIX) 20 MG tablet Take 20 mg by mouth daily.   Past Week at Unknown time  . Prenatal Vit-Fe Fumarate-FA (PRENATAL MULTIVITAMIN) TABS tablet Take 1 tablet by mouth daily at 12 noon.   05/01/2021 at Unknown time  . VITAMIN D PO Take by mouth.   05/01/2021 at Unknown time  . Ferrous Sulfate (IRON PO) Take by mouth.       Review of Systems  Gastrointestinal: Positive for abdominal pain (Cramping/Contractions).  Genitourinary: Negative for vaginal bleeding and vaginal  discharge.   Physical Exam   Blood pressure 131/76, pulse (!) 108, temperature 98.7 F (37.1 C), temperature source Oral, resp. rate 17, height 5\' 9"  (1.753 m), weight 126 kg, last menstrual period 04/19/2020, SpO2 100 %.  Physical Exam Constitutional:      Appearance: Normal appearance.  HENT:     Head: Normocephalic and atraumatic.  Eyes:     Conjunctiva/sclera: Conjunctivae normal.  Cardiovascular:     Rate and Rhythm: Normal rate.  Pulmonary:     Effort: Pulmonary effort is normal. No respiratory distress.  Abdominal:     Comments: Gravid  Skin:    General: Skin is warm and dry.  Neurological:     Mental Status: She is alert and oriented to person, place, and time.  Psychiatric:        Mood and Affect: Mood normal.        Behavior: Behavior normal.        Thought Content: Thought content normal.     Fetal Assessment 140 bpm, Mod Var, -Decels, +15x15 Accels Toco: Irritability, No ctx  graphed  MAU Course  No results found for this or any previous visit (from the past 24 hour(s)). No results found.  MDM PE Labs:None EFM  Assessment and Plan  26 year old G2P1001  SIUP at 37.3 weeks Cat I FT DFM  -Reassured that it is normal for fetal movement to be experienced upon arrival to hospital!  -NST Reactive -Will monitor and if strip remains reactive will discharge to home -Informed that nurse will return to complete vaginal exam. -Instructed to keep next appt as scheduled. -Encouraged to call or return to MAU if symptoms worsen or with the onset of new symptoms. -Discharged to home in stable condition.  22 MSN, CNM 05/02/2021, 12:36 AM

## 2021-05-02 NOTE — Discharge Instructions (Signed)
Polyhydramnios Polyhydramnios is a condition in which there is too much fluid in the sac that surrounds the unborn baby in the uterus (amniotic sac). The amniotic sac contains amniotic fluid that:  Cushions and protects the baby.  Helps the baby's lungs, kidneys, and digestive system to develop.  Allows the baby to move around, which helps the baby's muscles and bones develop. This condition can interfere with the baby's normal prenatal growth and development. It can happen any time during pregnancy, but is most likely to cause serious problems when there is a lot of extra fluid or it occurs early in pregnancy. What are the causes? This condition may be caused by:  Diabetes in the mother.  The baby being larger than normal for the baby's age (large for gestational age).  Problems that prevent the baby from swallowing amniotic fluid. These may include: ? An abnormal chromosome. ? Abnormality in the baby's intestinal tract. ? A birth defect in which the baby does not have a brain or parts of the brain (anencephaly).  A condition that affects twins, called twin-twin transfusion syndrome.  An illness in the mother, such as kidney or heart disease.  A tumor of the placenta.  An infection in the baby. What are the signs or symptoms? Symptoms of this condition include:  A uterus that is larger than it should be for the stage of pregnancy.  Shortness of breath.  Increased feeling of pressure on the abdomen.  Increased swelling in the legs.  Preterm labor.  Preeclampsia. Some problems that can result from this condition include:  Discomfort and trouble breathing for the mother caused by pressure from the uterus.  Early (premature) birth.  Prelabor rupture of membranes. This is when the amniotic sac breaks before labor starts.  Umbilical cord prolapse. This is when the umbilical cord enters the birth canal before the baby does.  The baby being in an abnormal position for  birth such as the baby's bottom or feet in position to come out first instead of the head (breech position).  Stillbirth.  Postpartum hemorrhage. This is when the mother loses too much blood after birth. How is this diagnosed? This condition may be diagnosed based on:  A measurement of your abdomen. Your health care provider may suspect the condition if he or she measures you and notices that your uterus is larger than it should be.  An ultrasound. This test uses painless, harmless sound waves to create an image of your uterus and your baby. The test can measure the amount of fluid in the amniotic sac (amniotic fluid index, or AFI). It can also: ? Show if you are carrying twins or more. ? Measure the growth of the baby. ? Look for birth defects.   How is this treated? Polyhydramnios often does not require any specific treatment. Your health care provider will screen you for health conditions that may contribute to the problem and treat underlying causes. Your health care provider may also refer you to a doctor who specializes in high risk pregnancy for closer monitoring. In cases where polyhydramnios causes difficult breathing, your health care provider may recommend removing fluid from the amniotic sac. This is done through a procedure where a needle is inserted through the skin into the uterus (amniocentesis). Follow these instructions at home: Lifestyle  Do not drink alcohol. No amount of alcohol is safe during pregnancy.  Do not use any products that contain nicotine or tobacco, such as cigarettes, e-cigarettes, and chewing tobacco. If you  need help quitting, ask your health care provider.  Do not use any illegal drugs. These can harm your developing baby or cause a miscarriage. General instructions  Take over-the-counter and prescription medicines only as told by your health care provider.  Keep any medical conditions you have under control. If you have diabetes, talk to your health  care provider about ways to manage your blood sugar.  Eat healthy foods, including a balance of fruits and vegetables, lean proteins, whole grains, and low-fat or nonfat dairy products.  Drink enough fluid to keep your urine pale yellow.  Keep all follow up visits, including prenatal visits. This is important. Contact a health care provider if:  You feel a great amount of pressure in your pelvis and are more uncomfortable than expected.  You need help managing any medical conditions you have. Get help right away if:  You have shortness of breath or trouble breathing.  You have a gush of fluid or are leaking fluid from your vagina.  You stop feeling the baby move or you don't feel the baby moving as you normally do.  You start to have labor pains (contractions). This may feel like a sense of tightening in your lower abdomen. Summary  Polyhydramnios means there is too much fluid in the amniotic sac.  This condition may be suspected if your abdomen is measuring large for gestational age. Diagnosis is based on measurement of amniotic fluid during an ultrasound.  Polyhydramnios often does not require any specific treatment.  Make sure to keep any medical conditions you have under control. If you have diabetes, talk to your health care provider about ways to manage your blood sugar. This information is not intended to replace advice given to you by your health care provider. Make sure you discuss any questions you have with your health care provider. Document Revised: 03/27/2020 Document Reviewed: 03/27/2020 Elsevier Patient Education  2021 Elsevier Inc.  

## 2021-05-11 ENCOUNTER — Telehealth (HOSPITAL_COMMUNITY): Payer: Self-pay | Admitting: *Deleted

## 2021-05-11 NOTE — Telephone Encounter (Signed)
Preadmission screen  

## 2021-05-12 ENCOUNTER — Other Ambulatory Visit (HOSPITAL_COMMUNITY)
Admission: RE | Admit: 2021-05-12 | Discharge: 2021-05-12 | Disposition: A | Discharge status: Home / Self Care | Payer: 59 | Source: Ambulatory Visit | Attending: Family Medicine | Admitting: Family Medicine

## 2021-05-12 ENCOUNTER — Other Ambulatory Visit: Payer: Self-pay

## 2021-05-12 DIAGNOSIS — Z01812 Encounter for preprocedural laboratory examination: Secondary | ICD-10-CM | POA: Insufficient documentation

## 2021-05-12 DIAGNOSIS — Z20822 Contact with and (suspected) exposure to covid-19: Secondary | ICD-10-CM | POA: Insufficient documentation

## 2021-05-12 LAB — SARS CORONAVIRUS 2 (TAT 6-24 HRS): SARS Coronavirus 2: NEGATIVE

## 2021-05-13 ENCOUNTER — Inpatient Hospital Stay (HOSPITAL_COMMUNITY): Payer: 59 | Admitting: Anesthesiology

## 2021-05-13 ENCOUNTER — Inpatient Hospital Stay (HOSPITAL_COMMUNITY)
Admission: AD | Admit: 2021-05-13 | Discharge: 2021-05-15 | DRG: 807 | Disposition: A | Discharge status: Home / Self Care | Payer: 59 | Attending: Obstetrics and Gynecology | Admitting: Obstetrics and Gynecology

## 2021-05-13 ENCOUNTER — Encounter (HOSPITAL_COMMUNITY): Payer: Self-pay

## 2021-05-13 ENCOUNTER — Inpatient Hospital Stay (HOSPITAL_COMMUNITY): Payer: 59

## 2021-05-13 ENCOUNTER — Other Ambulatory Visit: Payer: Self-pay

## 2021-05-13 DIAGNOSIS — O9902 Anemia complicating childbirth: Secondary | ICD-10-CM | POA: Diagnosis present

## 2021-05-13 DIAGNOSIS — O3660X0 Maternal care for excessive fetal growth, unspecified trimester, not applicable or unspecified: Secondary | ICD-10-CM | POA: Diagnosis present

## 2021-05-13 DIAGNOSIS — Z3A39 39 weeks gestation of pregnancy: Secondary | ICD-10-CM | POA: Diagnosis not present

## 2021-05-13 DIAGNOSIS — Z6841 Body Mass Index (BMI) 40.0 and over, adult: Secondary | ICD-10-CM

## 2021-05-13 DIAGNOSIS — Z20822 Contact with and (suspected) exposure to covid-19: Secondary | ICD-10-CM | POA: Diagnosis present

## 2021-05-13 DIAGNOSIS — O3663X0 Maternal care for excessive fetal growth, third trimester, not applicable or unspecified: Secondary | ICD-10-CM | POA: Diagnosis present

## 2021-05-13 DIAGNOSIS — Z3490 Encounter for supervision of normal pregnancy, unspecified, unspecified trimester: Secondary | ICD-10-CM | POA: Diagnosis present

## 2021-05-13 LAB — RPR: RPR Ser Ql: NONREACTIVE

## 2021-05-13 LAB — CBC
HCT: 35.3 % — ABNORMAL LOW (ref 36.0–46.0)
Hemoglobin: 11.7 g/dL — ABNORMAL LOW (ref 12.0–15.0)
MCH: 30.8 pg (ref 26.0–34.0)
MCHC: 33.1 g/dL (ref 30.0–36.0)
MCV: 92.9 fL (ref 80.0–100.0)
Platelets: 165 10*3/uL (ref 150–400)
RBC: 3.8 MIL/uL — ABNORMAL LOW (ref 3.87–5.11)
RDW: 13.2 % (ref 11.5–15.5)
WBC: 8.4 10*3/uL (ref 4.0–10.5)
nRBC: 0 % (ref 0.0–0.2)

## 2021-05-13 LAB — OB RESULTS CONSOLE RPR: RPR: NONREACTIVE

## 2021-05-13 LAB — OB RESULTS CONSOLE HIV ANTIBODY (ROUTINE TESTING): HIV: NONREACTIVE

## 2021-05-13 LAB — OB RESULTS CONSOLE RUBELLA ANTIBODY, IGM: Rubella: IMMUNE

## 2021-05-13 LAB — TYPE AND SCREEN
ABO/RH(D): O POS
Antibody Screen: NEGATIVE

## 2021-05-13 LAB — OB RESULTS CONSOLE HEPATITIS B SURFACE ANTIGEN: Hepatitis B Surface Ag: NEGATIVE

## 2021-05-13 MED ORDER — DIPHENHYDRAMINE HCL 50 MG/ML IJ SOLN: 12.5000 mg | INTRAMUSCULAR | Status: DC | PRN

## 2021-05-13 MED ORDER — PHENYLEPHRINE 40 MCG/ML (10ML) SYRINGE FOR IV PUSH (FOR BLOOD PRESSURE SUPPORT): 80.0000 ug | PREFILLED_SYRINGE | INTRAVENOUS | Status: DC | PRN

## 2021-05-13 MED ORDER — OXYTOCIN-SODIUM CHLORIDE 30-0.9 UT/500ML-% IV SOLN
1.0000 m[IU]/min | INTRAVENOUS | Status: DC
  Administered 2021-05-13: 2 m[IU]/min via INTRAVENOUS
  Filled 2021-05-13: qty 500

## 2021-05-13 MED ORDER — EPHEDRINE 5 MG/ML INJ: 10.0000 mg | INTRAVENOUS | Status: DC | PRN

## 2021-05-13 MED ORDER — ONDANSETRON HCL 4 MG/2ML IJ SOLN
4.0000 mg | Freq: Four times a day (QID) | INTRAMUSCULAR | Status: DC | PRN
  Administered 2021-05-14: 4 mg via INTRAVENOUS
  Filled 2021-05-13: qty 2

## 2021-05-13 MED ORDER — TERBUTALINE SULFATE 1 MG/ML IJ SOLN: 0.2500 mg | Freq: Once | INTRAMUSCULAR | Status: DC | PRN

## 2021-05-13 MED ORDER — TERBUTALINE SULFATE 1 MG/ML IJ SOLN
0.2500 mg | Freq: Once | INTRAMUSCULAR | Status: DC | PRN
Start: 2021-05-13 — End: 2021-05-14

## 2021-05-13 MED ORDER — OXYTOCIN 10 UNIT/ML IJ SOLN: 10.0000 [IU] | Freq: Once | INTRAMUSCULAR | Status: DC

## 2021-05-13 MED ORDER — SOD CITRATE-CITRIC ACID 500-334 MG/5ML PO SOLN: 30.0000 mL | ORAL | Status: DC | PRN

## 2021-05-13 MED ORDER — OXYTOCIN-SODIUM CHLORIDE 30-0.9 UT/500ML-% IV SOLN: 1.0000 m[IU]/min | INTRAVENOUS | Status: DC

## 2021-05-13 MED ORDER — LACTATED RINGERS IV SOLN
500.0000 mL | Freq: Once | INTRAVENOUS | Status: AC
  Administered 2021-05-13: 500 mL via INTRAVENOUS

## 2021-05-13 MED ORDER — ACETAMINOPHEN 325 MG PO TABS: 650.0000 mg | ORAL_TABLET | ORAL | Status: DC | PRN

## 2021-05-13 MED ORDER — MISOPROSTOL 50MCG HALF TABLET
50.0000 ug | ORAL_TABLET | ORAL | Status: DC | PRN
  Administered 2021-05-13: 50 ug via BUCCAL
  Filled 2021-05-13: qty 1

## 2021-05-13 MED ORDER — LACTATED RINGERS IV BOLUS
500.0000 mL | Freq: Once | INTRAVENOUS | Status: AC
  Administered 2021-05-13: 500 mL via INTRAVENOUS

## 2021-05-13 MED ORDER — FENTANYL-BUPIVACAINE-NACL 0.5-0.125-0.9 MG/250ML-% EP SOLN
12.0000 mL/h | EPIDURAL | Status: DC | PRN
  Administered 2021-05-13: 12 mL/h via EPIDURAL
  Filled 2021-05-13: qty 250

## 2021-05-13 MED ORDER — LIDOCAINE HCL (PF) 1 % IJ SOLN: 30.0000 mL | INTRAMUSCULAR | Status: DC | PRN

## 2021-05-13 MED ORDER — LIDOCAINE HCL (PF) 1 % IJ SOLN
INTRAMUSCULAR | Status: DC | PRN
  Administered 2021-05-13 (×2): 4 mL via EPIDURAL

## 2021-05-13 NOTE — H&P (Addendum)
OB ADMISSION/ HISTORY & PHYSICAL:  Admission Date: 05/13/2021  7:27 AM  Admit Diagnosis: Encounter for induction of labor [Z34.90]    Stacie Simpson is a 25 y.o. female presenting for IOL for LGA .  Prenatal History: G2P1001   EDC : 05/20/2021, by Other Basis  Prenatal care at [facility] since 1st trimester.    Prenatal course complicated by: LGA- EFW 8# Proven Pelvis up to 8#7oz.  Anemia- s/p IV venofer x2, and oral iron. BMI>40 History of PEC with G1-On BBASA   Prenatal Labs: ABO, Rh:   O Positive Antibody: NEG (12/22 1112) Rubella:   Immune RPR:   Non-Reactive HBsAg: NON REACTIVE (12/22 1112)  HIV:   Negative GBS:   Negative 1 hr Glucola : 134gtts Genetic Screening: Negative Ultrasound: EFW 8#0oz, 95%tile at 36wks. AC 99%tile, HC83%,  AFI, 20.3.  Posterior placenta.   Vaccines: TDaP          UTD         Flu             Desires at D/C                     COVID-19 UTD     Maternal Diabetes: No Genetic Screening: Normal Maternal Ultrasounds/Referrals: Normal Fetal Ultrasounds or other Referrals:  None Maternal Substance Abuse:  No Significant Maternal Medications:  None  Significant Maternal Lab Results:  Group B Strep negative Other Comments:  None  Medical / Surgical History :  Past medical history:  Past Medical History:  Diagnosis Date  . Anemia   . Anxiety   . Depression   . GERD (gastroesophageal reflux disease)   . Obesity   . Pregnancy induced hypertension   . TMJ (temporomandibular joint disorder)   . Vision abnormalities    Pt wears contacts     Past surgical history:  Past Surgical History:  Procedure Laterality Date  . PILONIDAL CYST / SINUS EXCISION    . PILONIDAL CYST EXCISION    . WISDOM TOOTH EXTRACTION  2013     Family History:  Family History  Problem Relation Age of Onset  . Healthy Mother   . Hypertension Father   . Hypertension Maternal Grandmother   . Hypertension Maternal Grandfather   . Diabetes Maternal Grandfather    . Heart disease Maternal Grandfather   . Hypertension Paternal Grandmother   . Hypertension Paternal Grandfather   . Cancer Paternal Grandfather        tongue  . Heart disease Paternal Grandfather      Social History:  reports that she has never smoked. She has never used smokeless tobacco. She reports previous alcohol use. She reports previous drug use. Drug: Marijuana.  Allergies: Patient has no known allergies.   Current Medications at time of admission:  Medications Prior to Admission  Medication Sig Dispense Refill Last Dose  . acetaminophen (TYLENOL) 500 MG tablet Take 500 mg by mouth every 6 (six) hours as needed.     Marland Kitchen aspirin 81 MG chewable tablet Chew by mouth daily.     . famotidine (PEPCID) 20 MG tablet Take 1 tablet (20 mg total) by mouth 2 (two) times daily. 60 tablet 1   . Ferrous Sulfate (IRON PO) Take by mouth.     . loratadine (CLARITIN) 10 MG tablet Take 10 mg by mouth daily as needed for allergies.     . pantoprazole (PROTONIX) 20 MG tablet Take 20 mg by mouth daily.     Marland Kitchen  Prenatal Vit-Fe Fumarate-FA (PRENATAL MULTIVITAMIN) TABS tablet Take 1 tablet by mouth daily at 12 noon.     Marland Kitchen VITAMIN D PO Take by mouth.        Review of Systems: Review of Systems  Constitutional: Negative for chills, fever and weight loss.  HENT: Negative for congestion, sinus pain and sore throat.   Eyes: Negative for blurred vision and double vision.  Respiratory: Negative for cough, shortness of breath, wheezing and stridor.   Cardiovascular: Negative for chest pain and palpitations.  Gastrointestinal: Negative for abdominal pain, heartburn, nausea and vomiting.  Musculoskeletal: Negative for falls.  Neurological: Negative for dizziness, tingling, seizures, loss of consciousness, weakness and headaches.  Psychiatric/Behavioral: Negative for depression, hallucinations, substance abuse and suicidal ideas. The patient is not nervous/anxious.     Physical Exam: Vital signs and  nursing notes reviewed.  Patient Vitals for the past 24 hrs:  Height Weight  05/13/21 0700 5\' 9"  (1.753 m) 125.6 kg     General: AAO x 3, NAD, coping well.  Heart: RRR Lungs:CTAB Abdomen: Gravid, NT, Vertex by SVE.  Extremities: mild non-pitting edema Genitalia / VE: Dilation: 4.5 Effacement (%): 60 Cervical Position: Middle Station: -2 Presentation: Vertex Exam by:: [email or web address]   Foley balloon placed on 6/1-present on admission.   FHR: BPM, 140 with moderate variability, 15x15 accels, absent decels TOCO: Ctx occasional.  Labs:   Pending T&S, CBC, RPR  No results for input(s): WBC, HGB, HCT, PLT in the last 72 hours.   Assessment:  25 y.o. G2P1001 at [redacted]w[redacted]d LGA- EFW 8# Proven Pelvis up to 8#7oz.  Anemia- s/p IV venofer x2, and oral iron. BMI>40 History of PEC with G1-On BBASA S/p Foley balloon placed 6/1  1. IOL for LGA  2. FHR category I 3. GBS Negative 4. Desires Epidural for pain management in labor  5. Breastfeeding 6. Placenta disposal to L&D   Plan:  1. Admit to BS 2. Routine L&D orders  -Regular Diet  -S/p Foley balloon, [redacted]w[redacted]d of Buccal Cytotec, reassess in 4 hours.  3. Analgesia/anesthesia PRN  - Pt may have epidural upon request 4. Active management 5. Anticipate NSVB- EFW 8#, proven pelvis up to 8#7oz.    Dr notified of admission / plan of care   Presley Summerlin [place]) [name], BSN, RNC-OB  Student Nurse-Midwife   05/13/2021  9:03 AM

## 2021-05-13 NOTE — Anesthesia Procedure Notes (Signed)
Epidural Patient location during procedure: OB Start time: 05/13/2021 7:53 PM End time: 05/13/2021 7:57 PM  Staffing Anesthesiologist: Kaylyn Layer, MD Performed: anesthesiologist   Preanesthetic Checklist Completed: patient identified, IV checked, risks and benefits discussed, monitors and equipment checked, pre-op evaluation and timeout performed  Epidural Patient position: sitting Prep: DuraPrep and site prepped and draped Patient monitoring: continuous pulse ox, blood pressure and heart rate Approach: midline Location: L3-L4 Injection technique: LOR air  Needle:  Needle type: Tuohy  Needle gauge: 17 G Needle length: 9 cm Needle insertion depth: 6 cm Catheter type: closed end flexible Catheter size: 19 Gauge Catheter at skin depth: 11 cm Test dose: negative and Other (1% lidocaine)  Assessment Events: blood not aspirated, injection not painful, no injection resistance, no paresthesia and negative IV test  Additional Notes Patient identified. Risks, benefits, and alternatives discussed with patient including but not limited to bleeding, infection, nerve damage, paralysis, failed block, incomplete pain control, headache, blood pressure changes, nausea, vomiting, reactions to medication, itching, and postpartum back pain. Confirmed with bedside nurse the patient's most recent platelet count. Confirmed with patient that they are not currently taking any anticoagulation, have any bleeding history, or any family history of bleeding disorders. Patient expressed understanding and wished to proceed. All questions were answered. Sterile technique was used throughout the entire procedure. Please see nursing notes for vital signs.   Crisp LOR on first pass. Test dose was given through epidural catheter and negative prior to continuing to dose epidural or start infusion. Warning signs of high block given to the patient including shortness of breath, tingling/numbness in hands, complete  motor block, or any concerning symptoms with instructions to call for help. Patient was given instructions on fall risk and not to get out of bed. All questions and concerns addressed with instructions to call with any issues or inadequate analgesia.  Reason for block:procedure for pain

## 2021-05-13 NOTE — Progress Notes (Signed)
S: Doing well. Pt expressed she is feeling more contractions, however not very frequent. She is feeling more vaginal pressure.    O: Vitals:   05/13/21 0750 05/13/21 1149 05/13/21 1300 05/13/21 1420  BP: 133/79 132/79 138/79 133/82  Pulse: 99 86 94 90  Resp: 16 16 17 17   Temp: 97.7 F (36.5 C)   97.7 F (36.5 C)  TempSrc: Oral   Oral  Weight:      Height:         FHT:  FHR: 145 bpm, variability: moderate,  accelerations:  Present,  decelerations:  Absent UC:   irregular, every 3-5 minutes, mild to palpation. SVE:   Dilation: 5 Effacement (%): 60 Station: -1 Exam by:: [email or web address] SNM    A / P: Induction of labor due to LGA, with minimal progression with cytotec.   -Cervix is soft and stretchy. Initiate Pitocin 2x2. Increase PRN.  Fetal Wellbeing:  Category I   -Monitor for sx of distress.  Pain Control:  currently labor support without medication. Pt desires nitrous Oxide for transition period of labor. If epidural is desired, pt may have eipdural upon request.   Anticipated MOD:  NSVD.   [name]) [name], BSN, RNC-OB  Student Nurse-Midwife   05/13/2021  2:59 PM

## 2021-05-13 NOTE — Progress Notes (Signed)
                                                                                                                                                                                                                                                                                                                       Stacie Simpson is a 25 y.o. G2P1001 at [redacted]w[redacted]d admitted for IOL for LGA S/P OF, buccal cytotec x 1, now Pitocin   Subjective: Requesting epidural, nitrous gas use for past couple hours, loosing efficacy. Feels more pressure w/ ctx and having harder time relaxing.   Objective: Vitals:   05/13/21 1730 05/13/21 1802 05/13/21 1832 05/13/21 1901  BP: 123/67 119/70 123/74 124/80  Pulse: 86 88 82 86  Resp:    18  Temp:      TempSrc:      Weight:      Height:         FHT:  FHR: 140 bpm, variability: moderate,  accelerations:  Present,  decelerations:  Absent UC:   regular, every 2-3 minutes SVE:   Dilation: 6 Effacement (%): 70 Station: -2 Exam by:: Arlan Organ, CNM BBOW, vertex  Labs:   Recent Labs    05/13/21 0850  WBC 8.4  HGB 11.7*  HCT 35.3*  PLT 165    Assessment / Plan: G2P1001 25 y.o. [redacted]w[redacted]d Induction of labor due to LGA ,  progressing well on pitocin  Labor: Good progress on Pitocin, continue as established, AROM when lower station Preeclampsia:  no signs or symptoms of toxicity Fetal Wellbeing:  Category I Pain Control:  Nitrous Oxide and will prep for epidural now I/D:  GBS neg Anticipated MOD:  NSVD  [name], CNM, MSN 05/13/2021, 7:22 PM

## 2021-05-13 NOTE — Progress Notes (Signed)
S: Doing well, pain well controlled with  Epidural, feels some pelvic pressure.  Discussed AROM, R/B and consents.    O: Vitals:   05/13/21 2030 05/13/21 2100 05/13/21 2200 05/13/21 2230  BP: 118/61 115/64 104/73 104/76  Pulse: 96 77 80 82  Resp:    16  Temp:    98.2 F (36.8 C)  TempSrc:    Oral  Weight:      Height:         FHT:  FHR: 130 bpm, variability: moderate,  accelerations:  Present,  decelerations:  Absent UC:   irregular, every 1.5-4 minutes SVE:   Dilation: 6 Effacement (%): 70 Station: -2 Exam by:: Kabria Hetzer, CNM AROM, clear fluid, large amount, slow controlled release, vertex well applied during contractions  A / P: Induction of labor due to LGA, latency, on Pitocin AROM augmentation Continue Pitocin 20 mu/min Position changes w/ peanut ball  Fetal Wellbeing:  Category I Pain Control:  Epidural  Anticipated MOD:  NSVD  [name], CNM, MSN 05/13/2021, 11:08 PM

## 2021-05-13 NOTE — Anesthesia Preprocedure Evaluation (Signed)
Anesthesia Evaluation  Patient identified by MRN, date of birth, ID band Patient awake    Reviewed: Allergy & Precautions, Patient's Chart, lab work & pertinent test results  History of Anesthesia Complications Negative for: history of anesthetic complications  Airway Mallampati: II  TM Distance: >3 FB Neck ROM: Full    Dental no notable dental hx.    Pulmonary neg pulmonary ROS,    Pulmonary exam normal        Cardiovascular hypertension (gestational), Normal cardiovascular exam     Neuro/Psych Anxiety Depression negative neurological ROS     GI/Hepatic Neg liver ROS, GERD  ,  Endo/Other  Morbid obesity  Renal/GU negative Renal ROS  negative genitourinary   Musculoskeletal negative musculoskeletal ROS (+)   Abdominal   Peds  Hematology  (+) anemia , Hgb 11.7   Anesthesia Other Findings Day of surgery medications reviewed with patient.  Reproductive/Obstetrics (+) Pregnancy (suspected LGA fetus)                             Anesthesia Physical Anesthesia Plan  ASA: III  Anesthesia Plan: Epidural   Post-op Pain Management:    Induction:   PONV Risk Score and Plan: Treatment may vary due to age or medical condition  Airway Management Planned: Natural Airway  Additional Equipment:   Intra-op Plan:   Post-operative Plan:   Informed Consent: I have reviewed the patients History and Physical, chart, labs and discussed the procedure including the risks, benefits and alternatives for the proposed anesthesia with the patient or authorized representative who has indicated his/her understanding and acceptance.       Plan Discussed with:   Anesthesia Plan Comments:         Anesthesia Quick Evaluation

## 2021-05-14 ENCOUNTER — Encounter (HOSPITAL_COMMUNITY): Payer: Self-pay

## 2021-05-14 LAB — CBC
HCT: 32.2 % — ABNORMAL LOW (ref 36.0–46.0)
Hemoglobin: 10.7 g/dL — ABNORMAL LOW (ref 12.0–15.0)
MCH: 30.8 pg (ref 26.0–34.0)
MCHC: 33.2 g/dL (ref 30.0–36.0)
MCV: 92.8 fL (ref 80.0–100.0)
Platelets: 163 10*3/uL (ref 150–400)
RBC: 3.47 MIL/uL — ABNORMAL LOW (ref 3.87–5.11)
RDW: 13.3 % (ref 11.5–15.5)
WBC: 10.3 10*3/uL (ref 4.0–10.5)
nRBC: 0 % (ref 0.0–0.2)

## 2021-05-14 MED ORDER — BENZOCAINE-MENTHOL 20-0.5 % EX AERO
1.0000 "application " | INHALATION_SPRAY | CUTANEOUS | Status: DC | PRN
  Administered 2021-05-14: 1 via TOPICAL
  Filled 2021-05-14: qty 56

## 2021-05-14 MED ORDER — SENNOSIDES-DOCUSATE SODIUM 8.6-50 MG PO TABS
2.0000 | ORAL_TABLET | Freq: Every day | ORAL | Status: DC
  Administered 2021-05-15: 2 via ORAL
  Filled 2021-05-14: qty 2

## 2021-05-14 MED ORDER — DIPHENHYDRAMINE HCL 25 MG PO CAPS: 25.0000 mg | ORAL_CAPSULE | Freq: Four times a day (QID) | ORAL | Status: DC | PRN

## 2021-05-14 MED ORDER — ACETAMINOPHEN 325 MG PO TABS
650.0000 mg | ORAL_TABLET | ORAL | Status: DC | PRN
  Administered 2021-05-14: 650 mg via ORAL
  Filled 2021-05-14: qty 2

## 2021-05-14 MED ORDER — OXYTOCIN-SODIUM CHLORIDE 30-0.9 UT/500ML-% IV SOLN
2.5000 [IU]/h | INTRAVENOUS | Status: DC
  Administered 2021-05-14: 2.5 [IU]/h via INTRAVENOUS

## 2021-05-14 MED ORDER — WITCH HAZEL-GLYCERIN EX PADS: 1.0000 "application " | MEDICATED_PAD | CUTANEOUS | Status: DC | PRN

## 2021-05-14 MED ORDER — COCONUT OIL OIL: 1.0000 "application " | TOPICAL_OIL | Status: DC | PRN

## 2021-05-14 MED ORDER — ONDANSETRON HCL 4 MG/2ML IJ SOLN: 4.0000 mg | INTRAMUSCULAR | Status: DC | PRN

## 2021-05-14 MED ORDER — TETANUS-DIPHTH-ACELL PERTUSSIS 5-2.5-18.5 LF-MCG/0.5 IM SUSY: 0.5000 mL | PREFILLED_SYRINGE | Freq: Once | INTRAMUSCULAR | Status: DC

## 2021-05-14 MED ORDER — SIMETHICONE 80 MG PO CHEW: 80.0000 mg | CHEWABLE_TABLET | ORAL | Status: DC | PRN

## 2021-05-14 MED ORDER — ONDANSETRON HCL 4 MG PO TABS: 4.0000 mg | ORAL_TABLET | ORAL | Status: DC | PRN

## 2021-05-14 MED ORDER — PRENATAL MULTIVITAMIN CH
1.0000 | ORAL_TABLET | Freq: Every day | ORAL | Status: DC
  Administered 2021-05-14 – 2021-05-15 (×2): 1 via ORAL
  Filled 2021-05-14 (×2): qty 1

## 2021-05-14 MED ORDER — IBUPROFEN 600 MG PO TABS
600.0000 mg | ORAL_TABLET | Freq: Four times a day (QID) | ORAL | Status: DC
  Administered 2021-05-14 – 2021-05-15 (×6): 600 mg via ORAL
  Filled 2021-05-14 (×7): qty 1

## 2021-05-14 MED ORDER — ZOLPIDEM TARTRATE 5 MG PO TABS: 5.0000 mg | ORAL_TABLET | Freq: Every evening | ORAL | Status: DC | PRN

## 2021-05-14 MED ORDER — OXYTOCIN BOLUS FROM INFUSION
333.0000 mL | Freq: Once | INTRAVENOUS | Status: AC
  Administered 2021-05-14: 333 mL via INTRAVENOUS

## 2021-05-14 MED ORDER — DIBUCAINE (PERIANAL) 1 % EX OINT: 1.0000 "application " | TOPICAL_OINTMENT | CUTANEOUS | Status: DC | PRN

## 2021-05-14 NOTE — Social Work (Signed)
MOB was referred for history of depression and anxiety.   * Referral screened out by Clinical Social Worker because none of the following criteria appear to apply:  ~ History of anxiety/depression during this pregnancy, or of post-partum depression following prior delivery. No concerns noted in prenatal care.  ~ Diagnosis of anxiety and/or depression within last 3 years. CSW reviewed chart and a diagnosis in 2013. MOB previously reported she has had no symptoms since that time. OR * MOB's symptoms currently being treated with medication and/or therapy.  Please contact the Clinical Social Worker if needs arise, by [facility] request, or if MOB scores greater than 9/yes to question 10 on Edinburgh Postpartum Depression Screen.  Manfred Arch, LCSWA Clinical Social Work [employer] and [employer]  [phone]

## 2021-05-14 NOTE — Lactation Note (Signed)
This note was copied from a baby's chart. Lactation Consultation Note  Patient Name: Stacie Simpson [id number] Date: 05/14/2021 Reason for consult: L&D Initial assessment;Term Age:26 hours P2, term female infant. LC entered the room in L&D, mom was doing STS with infant, infant was sucking on her thumb. Mom latched infant on her left breast using the football hold position, infant latched with depth and BF for 10 minutes. Mom knows how to hand express and infant was given 3 mls of colostrum by spoon.  Infant started cuing again and mom re-latched infant on her left breast with football hold position as LC was leaving the room. Mom knows to breastfeed infant according to primal cues: licking, kissing, tasting, hands or fist in mouth, BF infant STS. Mom knows to call RN or LC on MBU if she has questions, concerns or need further assistance with latching infant at the breast. LC discussed infant's input and output with parents.  Maternal Data Has patient been taught Hand Expression?: Yes Does the patient have breastfeeding experience prior to this delivery?: Yes How long did the patient breastfeed?: Per mom, she BF 1st child for 3 months until infant started teething  Feeding Mother's Current Feeding Choice: Breast Milk  LATCH Score Latch: Grasps breast easily, tongue down, lips flanged, rhythmical sucking.  Audible Swallowing: A few with stimulation  Type of Nipple: Everted at rest and after stimulation  Comfort (Breast/Nipple): Soft / non-tender  Hold (Positioning): Assistance needed to correctly position infant at breast and maintain latch.  LATCH Score: 8   Lactation Tools Discussed/Used    Interventions Interventions: Breast feeding basics reviewed;Assisted with latch;Skin to skin;Hand express;Adjust position;Breast compression;Support pillows;Position options;Expressed milk;DEBP;Education  Discharge Pump: Personal WIC Program: No  Consult Status Consult Status:  Follow-up Date: 05/14/21 Follow-up type: In-patient    Danelle Earthly 05/14/2021, 3:11 AM

## 2021-05-14 NOTE — Anesthesia Postprocedure Evaluation (Signed)
Anesthesia Post Note  Patient: AZKA STEGER  Procedure(s) Performed: AN AD HOC LABOR EPIDURAL     Patient location during evaluation: Mother Baby Anesthesia Type: Epidural Level of consciousness: awake and alert and oriented Pain management: satisfactory to patient Vital Signs Assessment: post-procedure vital signs reviewed and stable Respiratory status: spontaneous breathing and nonlabored ventilation Cardiovascular status: stable Postop Assessment: no headache, no backache, no signs of nausea or vomiting, adequate PO intake, patient able to bend at knees and able to ambulate (patient up walking) Anesthetic complications: no   No complications documented.  Last Vitals:  Vitals:   05/14/21 0530 05/14/21 0922  BP: 118/78 116/76  Pulse: 70 83  Resp: 18 18  Temp: 36.7 C 36.9 C  SpO2: 98% 99%    Last Pain:  Vitals:   05/14/21 0922  TempSrc:   PainSc: 2    Pain Goal: Patients Stated Pain Goal: 2 (05/13/21 1832)              Epidural/Spinal Function Cutaneous sensation: Normal sensation (05/14/21 0922), Patient able to flex knees: Yes (05/14/21 2440), Patient able to lift hips off bed: Yes (05/14/21 0922), Back pain beyond tenderness at insertion site: No (05/14/21 0922), Progressively worsening motor and/or sensory loss: No (05/14/21 0922), Bowel and/or bladder incontinence post epidural: No (05/14/21 0922)  Madison Hickman

## 2021-05-15 MED ORDER — IBUPROFEN 600 MG PO TABS: 600.0000 mg | ORAL_TABLET | Freq: Four times a day (QID) | ORAL | 0 refills | Status: DC | PRN

## 2021-05-15 NOTE — Discharge Summary (Signed)
Postpartum Discharge Summary  Date of Service updated 05/15/21     Patient Name: Stacie Simpson DOB: [birth year] MRN: [id number]  Date of admission: 05/13/2021 Delivery date:05/14/2021  Delivering provider: Juliene Pina  Date of discharge: 05/15/2021  Admitting diagnosis: Encounter for induction of labor [Z34.90] Intrauterine pregnancy: [redacted]w[redacted]d    Secondary diagnosis:  Principal Problem:   Postpartum care following vaginal delivery (6/3) Active Problems:   Encounter for induction of labor   LGA (large for gestational age) fetus affecting management of mother   BMI 40.0-44.9, adult ([facility]  Additional problems: none    Discharge diagnosis: Term Pregnancy Delivered                                              Post partum procedures:n/a Augmentation: AROM and Pitocin Complications: None  Hospital course: Induction of Labor With Vaginal Delivery   25y.o. yo [id number] admitted to the hospital 05/13/2021 for induction of labor.  Indication for induction: Elective.  Patient had an uncomplicated labor course as follows: Membrane Rupture Time/Date: 10:45 PM ,05/13/2021   Delivery Method:Vaginal, Spontaneous  Episiotomy: None  Lacerations:  None  Details of delivery can be found in separate delivery note.  Patient had a routine postpartum course. Patient is discharged home 05/15/21.  Newborn Data: Birth date:05/14/2021  Birth time:1:43 AM  Gender:Female  Living status:Living  Apgars:9 ,9  Weight:3841 g   Magnesium Sulfate received: No BMZ received: No Rhophylac:N/A MMR:N/A T-DaP:Given prenatally  Physical exam  Vitals:   05/14/21 1214 05/14/21 1649 05/14/21 2026 05/15/21 0540  BP: 115/81 122/83 116/71 115/76  Pulse: 75 75 81 68  Resp:  18 17 18   Temp: 97.7 F (36.5 C) 98.1 F (36.7 C) 98 F (36.7 C) 98 F (36.7 C)  TempSrc: Axillary  Oral Oral  SpO2: 99% 98% 99% 98%  Weight:      Height:       General: alert and no distress Lochia: appropriate Uterine  Fundus: firm Incision: Healing well with no significant drainage, N/A DVT Evaluation: No evidence of DVT seen on physical exam. Labs: Lab Results  Component Value Date   WBC 10.3 05/14/2021   HGB 10.7 (L) 05/14/2021   HCT 32.2 (L) 05/14/2021   MCV 92.8 05/14/2021   PLT 163 05/14/2021   CMP Latest Ref Rng & Units 12/03/2020  Glucose 70 - 99 mg/dL 85  BUN 6 - 20 mg/dL <5(L)  Creatinine 0.44 - 1.00 mg/dL 0.65  Sodium 135 - 145 mmol/L 135  Potassium 3.5 - 5.1 mmol/L 3.5  Chloride 98 - 111 mmol/L 105  CO2 22 - 32 mmol/L 21(L)  Calcium 8.9 - 10.3 mg/dL 8.6(L)  Total Protein 6.5 - 8.1 g/dL 5.9(L)  Total Bilirubin 0.3 - 1.2 mg/dL 0.8  Alkaline Phos 38 - 126 U/L 37(L)  AST 15 - 41 U/L 39  ALT 0 - 44 U/L 71(H)   EdFlavia Shippercore: Edinburgh Postnatal Depression Scale Screening Tool 05/15/2021  I have been able to laugh and see the funny side of things. 0  I have looked forward with enjoyment to things. 0  I have blamed myself unnecessarily when things went wrong. 1  I have been anxious or worried for no good reason. 2  I have felt scared or panicky for no good reason. 0  Things have been getting on  top of me. 1  I have been so unhappy that I have had difficulty sleeping. 0  I have felt sad or miserable. 0  I have been so unhappy that I have been crying. 1  The thought of harming myself has occurred to me. 0  Edinburgh Postnatal Depression Scale Total 5      After visit meds:  Allergies as of 05/15/2021   No Known Allergies     Medication List    STOP taking these medications   aspirin 81 MG chewable tablet     TAKE these medications   acetaminophen 500 MG tablet Commonly known as: TYLENOL Take 500 mg by mouth every 6 (six) hours as needed.   famotidine 20 MG tablet Commonly known as: Pepcid Take 1 tablet (20 mg total) by mouth 2 (two) times daily.   ibuprofen 600 MG tablet Commonly known as: ADVIL Take 1 tablet (600 mg total) by mouth every 6 (six) hours as needed.    IRON PO Take by mouth.   loratadine 10 MG tablet Commonly known as: CLARITIN Take 10 mg by mouth daily as needed for allergies.   pantoprazole 20 MG tablet Commonly known as: PROTONIX Take 20 mg by mouth daily.   prenatal multivitamin Tabs tablet Take 1 tablet by mouth daily at 12 noon.   VITAMIN D PO Take by mouth.            Discharge Care Instructions  (From admission, onward)         Start     Ordered   05/15/21 0000  Discharge wound care:       Comments: Sitz baths 2 times /day with warm water x 1 week. May add herbals: 1 ounce dried comfrey leaf* 1 ounce calendula flowers 1 ounce lavender flowers  Supplies can be found online at [employer] sources at [employer], [employer]  1/2 ounce dried uva ursi leaves 1/2 ounce witch hazel blossoms (if you can find them) 1/2 ounce dried sage leaf 1/2 cup sea salt Directions: Bring 2 quarts of water to a boil. Turn off heat, and place 1 ounce (approximately 1 large handful) of the above mixed herbs (not the salt) into the pot. Steep, covered, for 30 minutes.  Strain the liquid well with a fine mesh strainer, and discard the herb material. Add 2 quarts of liquid to the tub, along with the 1/2 cup of salt. This medicinal liquid can also be made into compresses and peri-rinses.   05/15/21 1027           Discharge home in stable condition Infant Feeding: Breast Infant Disposition:home with mother Discharge instruction: per After Visit Summary and Postpartum booklet. Activity: Advance as tolerated. Pelvic rest for 6 weeks.  Diet: routine diet Anticipated Birth Control: Unsure Postpartum Appointment:6 weeks Additional Postpartum F/U: n/a Future Appointments:No future appointments. Follow up Visit: Patient to be scheduled for routine 6 week postpartum visit with Midwife provider at [place].    05/15/2021 Indira Sorenson A Evoleth Nordmeyer, DO

## 2021-10-20 ENCOUNTER — Emergency Department: Admit: 2021-10-20 | Discharge status: Absent without leave | Payer: Self-pay

## 2021-10-20 ENCOUNTER — Emergency Department (INDEPENDENT_AMBULATORY_CARE_PROVIDER_SITE_OTHER)
Admission: EM | Admit: 2021-10-20 | Discharge: 2021-10-20 | Disposition: A | Discharge status: Home / Self Care | Payer: 59 | Source: Home / Self Care

## 2021-10-20 ENCOUNTER — Encounter: Payer: Self-pay | Admitting: *Deleted

## 2021-10-20 ENCOUNTER — Other Ambulatory Visit: Payer: Self-pay

## 2021-10-20 DIAGNOSIS — J0100 Acute maxillary sinusitis, unspecified: Secondary | ICD-10-CM

## 2021-10-20 DIAGNOSIS — J309 Allergic rhinitis, unspecified: Secondary | ICD-10-CM

## 2021-10-20 DIAGNOSIS — R059 Cough, unspecified: Secondary | ICD-10-CM | POA: Diagnosis not present

## 2021-10-20 MED ORDER — CEFDINIR 300 MG PO CAPS: 300.0000 mg | ORAL_CAPSULE | Freq: Two times a day (BID) | ORAL | 0 refills | Status: AC

## 2021-10-20 MED ORDER — FEXOFENADINE HCL 180 MG PO TABS: 180.0000 mg | ORAL_TABLET | Freq: Every day | ORAL | 0 refills | Status: DC

## 2021-10-20 MED ORDER — BENZONATATE 200 MG PO CAPS: 200.0000 mg | ORAL_CAPSULE | Freq: Three times a day (TID) | ORAL | 0 refills | Status: AC | PRN

## 2021-10-20 NOTE — Discharge Instructions (Addendum)
Advised patient to take medication as directed with food to completion.  Advised patient to discontinue current Claritin and start Allegra with first dose of antibiotics for the next 5 of 7 days, then as needed for concurrent postnasal drainage/drip.  Advised patient may take Tessalon Perles daily, as needed for cough.  Encouraged patient increase daily water intake while taking these medications.

## 2021-10-20 NOTE — ED Provider Notes (Signed)
[facility]    CSN: [id number] Arrival date & time: 10/20/21  0908      History   Chief Complaint Chief Complaint  Patient presents with   Cough    HPI Stacie Simpson is a 25 y.o. female.   HPI 25 year old female presents with cough, runny nose, tightness in chest for 1 week.  Reports negative home COVID-19 test.  Reports she is 4.[redacted] weeks pregnant.  Past Medical History:  Diagnosis Date   Anemia    Anxiety    Depression    GERD (gastroesophageal reflux disease)    Obesity    Pregnancy induced hypertension    TMJ (temporomandibular joint disorder)    Vision abnormalities    Pt wears contacts    Patient Active Problem List   Diagnosis Date Noted   Encounter for induction of labor 05/13/2021   LGA (large for gestational age) fetus affecting management of mother 05/13/2021   BMI 40.0-44.9, adult ([facility]) 05/13/2021   Nausea and vomiting in pregnancy    Elevated liver enzymes    Upper abdominal pain    IDA (iron deficiency anemia) 10/24/2014   Acute blood loss anemia 10/24/2014   SVD (spontaneous vaginal delivery) 10/22/2014   Postpartum care following vaginal delivery (6/3) 10/22/2014   Gestational hypertension 10/21/2014    Past Surgical History:  Procedure Laterality Date   PILONIDAL CYST / SINUS EXCISION     PILONIDAL CYST EXCISION     WISDOM TOOTH EXTRACTION  2013    OB History     Gravida  3   Para  2   Term  2   Preterm      AB      Living  2      SAB      IAB      Ectopic      Multiple  0   Live Births  2            Home Medications    Prior to Admission medications   Medication Sig Start Date End Date Taking? Authorizing Provider  benzonatate (TESSALON) 200 MG capsule Take 1 capsule (200 mg total) by mouth 3 (three) times daily as needed for up to 7 days for cough. 10/20/21 10/27/21 Yes Trevor Iha, FNP  cefdinir (OMNICEF) 300 MG capsule Take 1 capsule (300 mg total) by mouth 2 (two) times daily for 7 days.  10/20/21 10/27/21 Yes Trevor Iha, FNP  fexofenadine [facility] ALLERGY) 180 MG tablet Take 1 tablet (180 mg total) by mouth daily for 15 days. 10/20/21 11/04/21 Yes Trevor Iha, FNP  loratadine (CLARITIN) 10 MG tablet Take 10 mg by mouth daily as needed for allergies.   Yes [provider]  Prenatal Vit-Fe Fumarate-FA (PRENATAL MULTIVITAMIN) TABS tablet Take 1 tablet by mouth daily at 12 noon.   Yes [provider]  fluticasone (FLONASE) 50 MCG/ACT nasal spray Place 1-2 sprays into both nostrils daily as needed for allergies or rhinitis.  04/10/21  [provider]    Family History Family History  Problem Relation Age of Onset   Healthy Mother    Hypertension Father    Hypertension Maternal Grandmother    Hypertension Maternal Grandfather    Diabetes Maternal Grandfather    Heart disease Maternal Grandfather    Hypertension Paternal Grandmother    Hypertension Paternal Grandfather    Cancer Paternal Grandfather        tongue   Heart disease Paternal Grandfather     Social History  Social History   Tobacco Use   Smoking status: Never   Smokeless tobacco: Never  Vaping Use   Vaping Use: Never used  Substance Use Topics   Alcohol use: Not Currently   Drug use: Not Currently    Types: Marijuana     Allergies   Patient has no known allergies.   Review of Systems Review of Systems  HENT:  Positive for congestion, postnasal drip and rhinorrhea.   All other systems reviewed and are negative.   Physical Exam Triage Vital Signs ED Triage Vitals  Enc Vitals Group     BP 10/20/21 0925 111/79     Pulse Rate 10/20/21 0925 (!) 104     Resp 10/20/21 0925 18     Temp 10/20/21 0925 (!) 97.5 F (36.4 C)     Temp Source 10/20/21 0925 Oral     SpO2 10/20/21 0925 98 %     Weight 10/20/21 0924 265 lb (120.2 kg)     Height 10/20/21 0924 5\' 9"  (1.753 m)     Head Circumference --      Peak Flow --      Pain Score 10/20/21 0924 0     Pain Loc --       Pain Edu? --      Excl. in GC? --    No data found.  Updated Vital Signs BP 111/79 (BP Location: Right Arm)   Pulse (!) 104   Temp (!) 97.5 F (36.4 C) (Oral)   Resp 18   Ht 5\' 9"  (1.753 m)   Wt 265 lb (120.2 kg)   LMP 09/16/2021   SpO2 98%   BMI 39.13 kg/m      Physical Exam Vitals and nursing note reviewed.  Constitutional:      Appearance: Normal appearance. She is obese.  HENT:     Head: Normocephalic and atraumatic.     Right Ear: Tympanic membrane, ear canal and external ear normal.     Left Ear: Tympanic membrane, ear canal and external ear normal.     Mouth/Throat:     Mouth: Mucous membranes are moist.     Pharynx: Oropharynx is clear.     Comments: Moderate amount of clear drainage of posterior oropharynx noted Eyes:     Extraocular Movements: Extraocular movements intact.     Conjunctiva/sclera: Conjunctivae normal.     Pupils: Pupils are equal, round, and reactive to light.  Cardiovascular:     Rate and Rhythm: Normal rate.     Pulses: Normal pulses.     Heart sounds: Normal heart sounds.  Pulmonary:     Effort: Pulmonary effort is normal.     Breath sounds: Normal breath sounds.  Musculoskeletal:        General: Normal range of motion.     Cervical back: Normal range of motion and neck supple.  Skin:    General: Skin is warm and dry.  Neurological:     General: No focal deficit present.     Mental Status: She is alert and oriented to person, place, and time.     UC Treatments / Results  Labs (all labs ordered are listed, but only abnormal results are displayed) Labs Reviewed - No data to display  EKG   Radiology No results found.  Procedures Procedures (including critical care time)  Medications Ordered in UC Medications - No data to display  Initial Impression / Assessment and Plan / UC Course  I have reviewed the triage vital  signs and the nursing notes.  Pertinent labs & imaging results that were available during my care of the  patient were reviewed by me and considered in my medical decision making (see chart for details).     MDM: 1.  Subacute maxillary sinusitis-Rx'd Cefdinir; 2.  Allergic rhinitis-Rx'd Allegra; 3.  Cough-Rx'd Tessalon Perles. Advised patient to take medication as directed with food to completion.  Advised patient to discontinue current Claritin and start Allegra with first dose of antibiotics for the next 5 of 7 days, then as needed for concurrent postnasal drainage/drip.  Advised patient may take Tessalon Perles daily, as needed for cough.  Encouraged patient increase daily water intake while taking these medications.  Patient discharged home, hemodynamically stable. Final Clinical Impressions(s) / UC Diagnoses   Final diagnoses:  Cough, unspecified type  Subacute maxillary sinusitis  Allergic rhinitis, unspecified seasonality, unspecified trigger     Discharge Instructions      Advised patient to take medication as directed with food to completion.  Advised patient to discontinue current Claritin and start Allegra with first dose of antibiotics for the next 5 of 7 days, then as needed for concurrent postnasal drainage/drip.  Advised patient may take Tessalon Perles daily, as needed for cough.  Encouraged patient increase daily water intake while taking these medications.     ED Prescriptions     Medication Sig Dispense Auth. Provider   cefdinir (OMNICEF) 300 MG capsule Take 1 capsule (300 mg total) by mouth 2 (two) times daily for 7 days. 14 capsule Trevor Iha, FNP   fexofenadine [facility] ALLERGY) 180 MG tablet Take 1 tablet (180 mg total) by mouth daily for 15 days. 15 tablet Trevor Iha, FNP   benzonatate (TESSALON) 200 MG capsule Take 1 capsule (200 mg total) by mouth 3 (three) times daily as needed for up to 7 days for cough. 40 capsule Trevor Iha, FNP      PDMP not reviewed this encounter.   Trevor Iha, FNP 10/20/21 1015

## 2021-10-20 NOTE — ED Triage Notes (Signed)
Pt c/o cough, runny nose, and burning/ tightness in chest x 1 wk. At home Covid test negative. She is 4 1/[redacted] wks pregnant.

## 2021-12-12 NOTE — L&D Delivery Note (Signed)
Vaginal Delivery Note  Patient pushed for less than 5 minutes after she was noted to be C/C/+2. Guided pushing with maternal urge and regular contractions. At 12:12  a delivery of a viable and healthy  female was delivered over intact perineum via  Spontaneous vaginal delivery (Presentation: OA).  After head was delivered, shoulders and body easily delivered.   Baby laid on maternal abdomen, bulb suction, drying and tactile stimulation performed. Baby noted to have a vigorous cry and moving all four extremities.  Delayed cord clamping done and cord cut by father. Cord blood obtained.   Placenta spontaneously delivered intact with trailing membranes. Uterine atony alleviated by IV pitocin and massage.  There were no lacerations noted upon inspection of the cervix, vagina and perineum.   Patient tolerated delivery well, there were no complications.    Delivery Details: Delivery Type: NSVD  Anesthesia Epidural   Episiotomy: None   Lacerations: None   Repair suture: N/A  Blood loss (ml):   25   Birth information: Date of birth:   06/23/2023   Time of birth:  12:12  Sex:   female  Name:  "Stacie Simpson"  APGAR APGAR (1 MIN): 9   APGAR (5 MINS): 9    Weight  Pending   Resuscitation:     Drying, stimulation, bulb suction  Cord information: 3 vessel cord  Complications:     None   Placenta: Delivered: Spontaneous , intact appearance: Normal     Disposition: Mom to postpartum.  Baby to Couplet care / Skin to Skin.  Essie Hart MD 06/23/2022, 12:28 PM

## 2022-01-05 LAB — HEPATITIS C ANTIBODY: HCV Ab: NEGATIVE

## 2022-01-05 LAB — OB RESULTS CONSOLE RPR: RPR: NONREACTIVE

## 2022-01-05 LAB — OB RESULTS CONSOLE HIV ANTIBODY (ROUTINE TESTING): HIV: NONREACTIVE

## 2022-01-05 LAB — OB RESULTS CONSOLE ABO/RH: RH Type: POSITIVE

## 2022-01-05 LAB — OB RESULTS CONSOLE RUBELLA ANTIBODY, IGM: Rubella: IMMUNE

## 2022-01-05 LAB — OB RESULTS CONSOLE HEPATITIS B SURFACE ANTIGEN: Hepatitis B Surface Ag: NEGATIVE

## 2022-01-05 LAB — OB RESULTS CONSOLE ANTIBODY SCREEN: Antibody Screen: NEGATIVE

## 2022-01-10 LAB — OB RESULTS CONSOLE GC/CHLAMYDIA
Chlamydia: NEGATIVE
Neisseria Gonorrhea: NEGATIVE

## 2022-02-16 IMAGING — US US ABDOMEN COMPLETE
1 series · 15 of 25 positions shown · non-contrast
Comparison: None.

CLINICAL DATA: Pregnant patient with nausea and vomiting. Previous
coronavirus infection.

EXAM:
ABDOMEN ULTRASOUND COMPLETE

[Series 1: us abdomen complete · 15 of 72 slices shown]
[im 1/72]
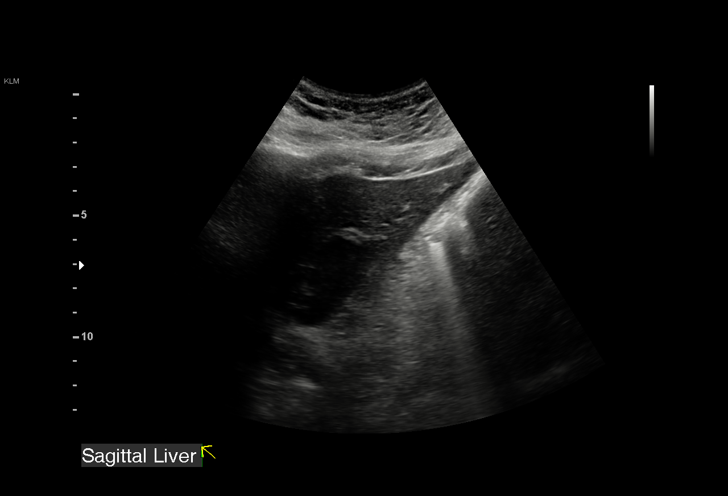
[im 6/72]
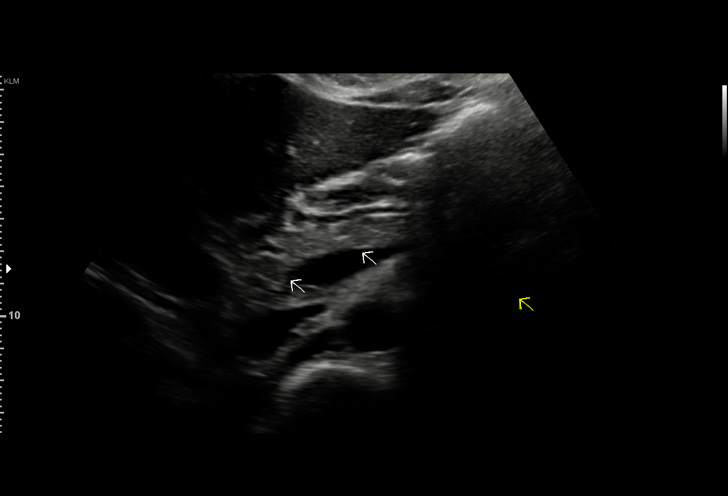
[im 12/72]
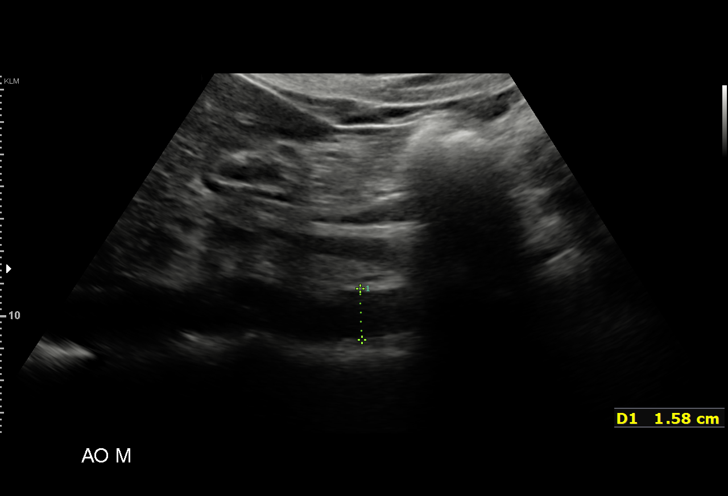
[im 15/72]
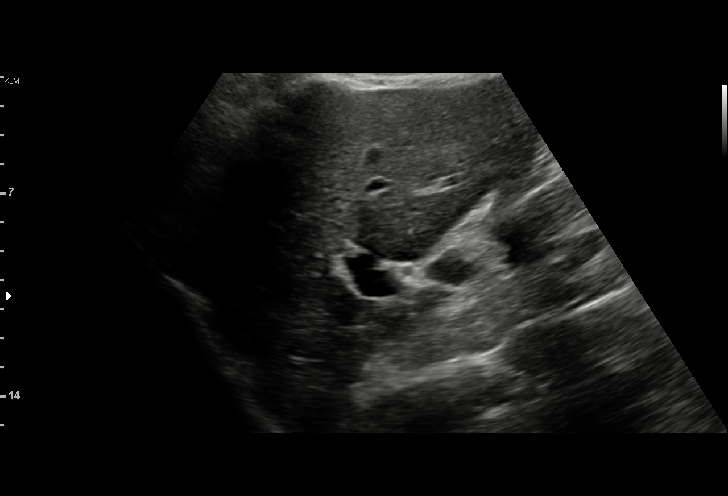
[im 21/72]
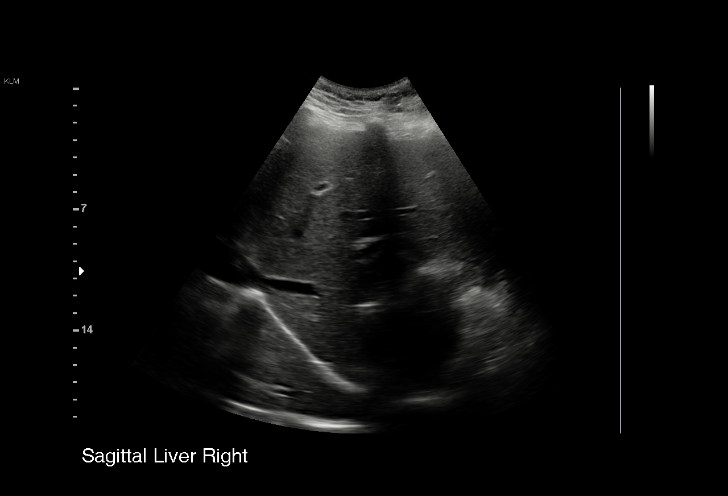
[im 27/72]
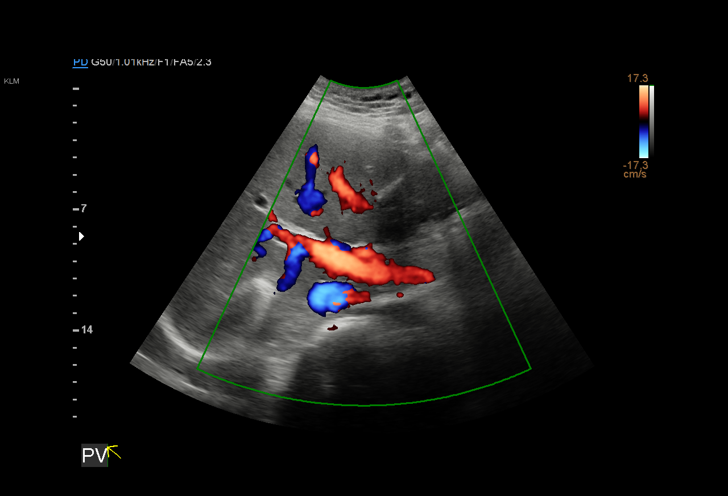
[im 30/72]
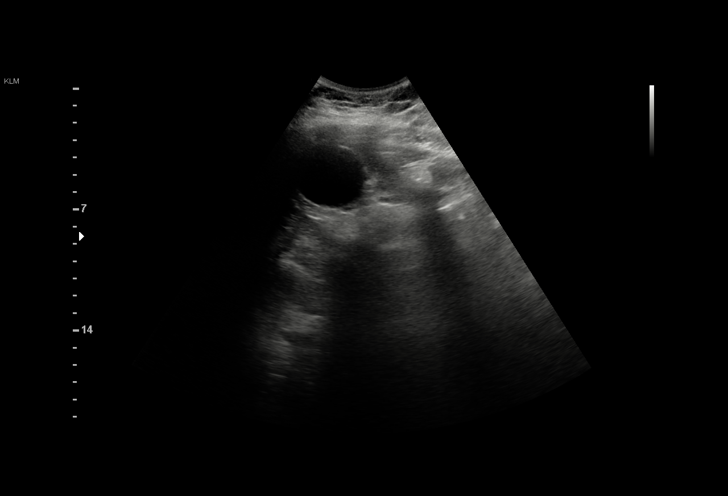
[im 36/72]
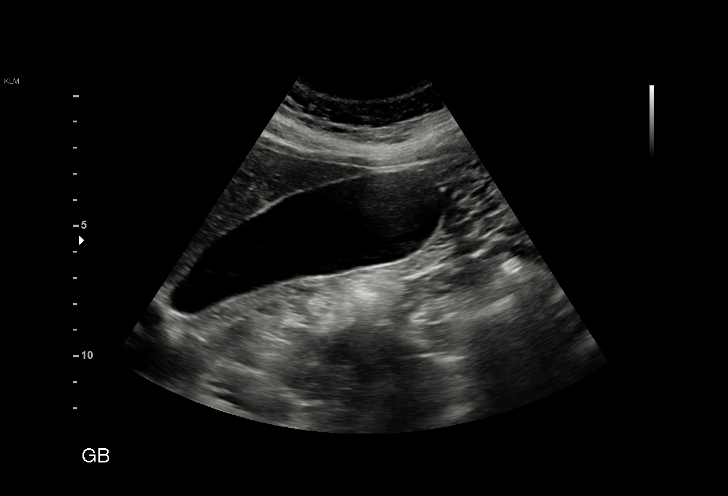
[im 42/72]
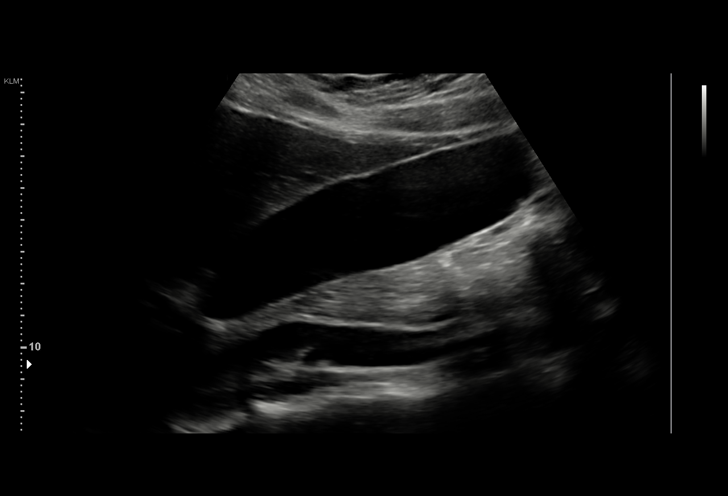
[im 45/72]
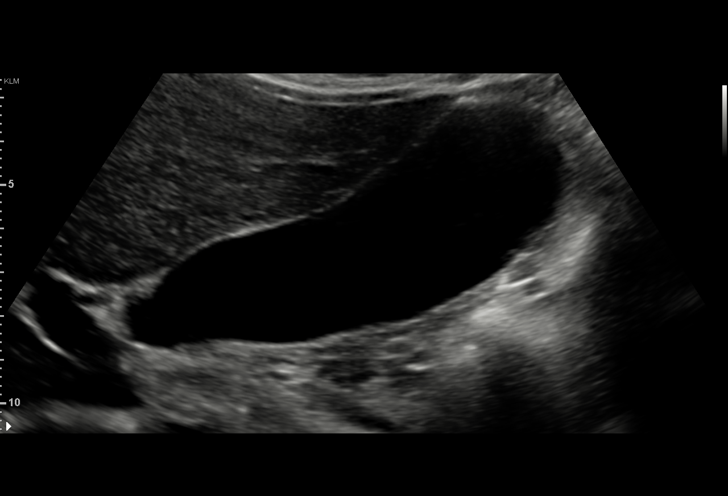
[im 51/72]
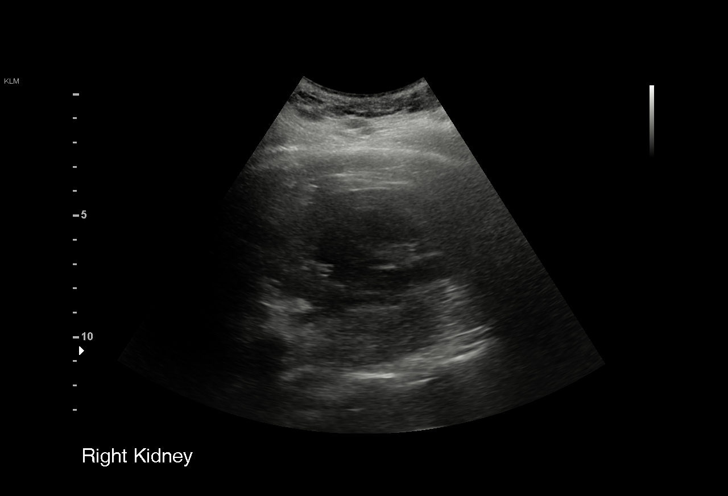
[im 57/72]
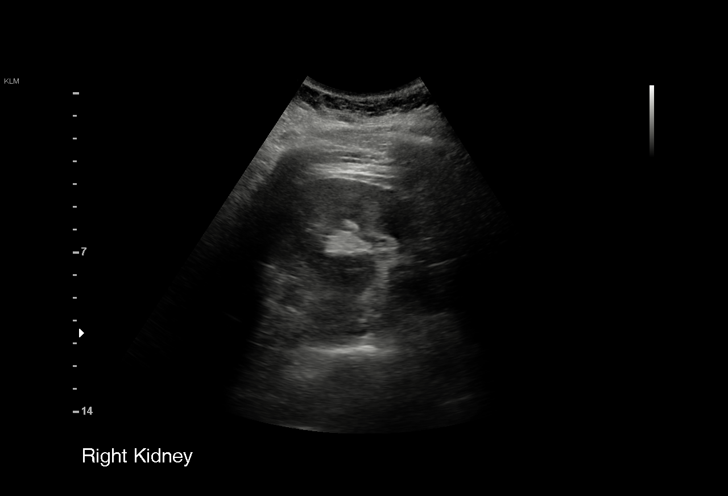
[im 60/72]
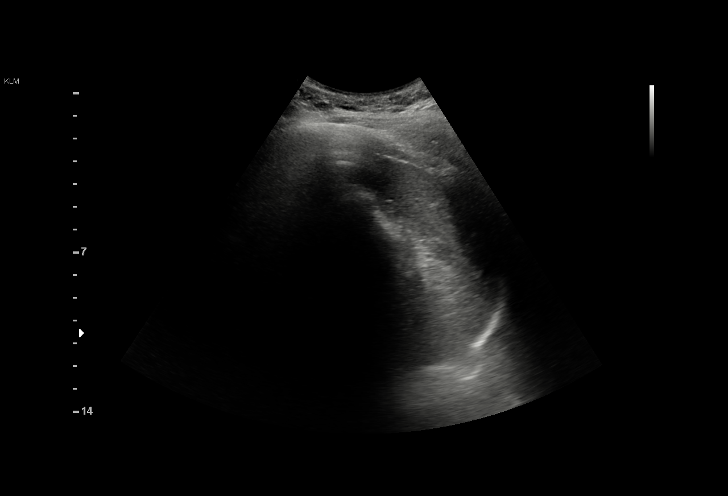
[im 66/72]
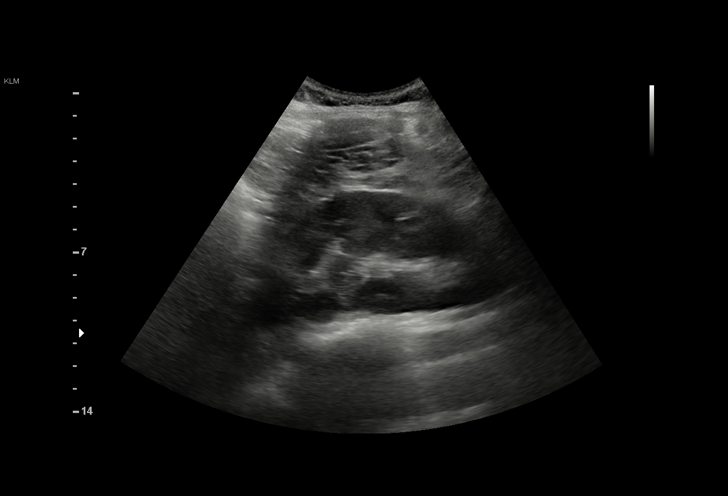
[im 72/72]
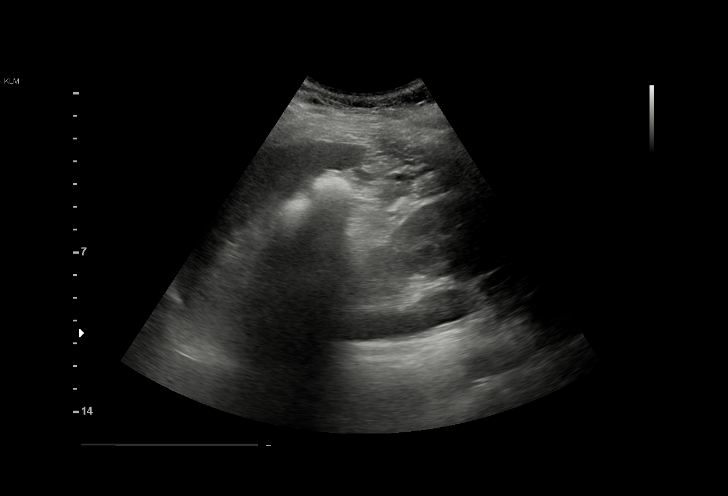

[15 of 25 positions shown; findings below may reference images not displayed]

FINDINGS: Gallbladder: No gallstones or wall thickening visualized. No
sonographic Murphy sign noted by sonographer.

Common bile duct: Diameter: 1.9 mm common normal.

Liver: No focal lesion identified. Within normal limits in
parenchymal echogenicity. Portal vein is patent on color Doppler
imaging with normal direction of blood flow towards the liver.

IVC: No abnormality visualized.

Pancreas: Visualized portion unremarkable.

Spleen: Size and appearance within normal limits.

Right Kidney: Length: 12.9 cm. Echogenicity within normal limits. No
mass or hydronephrosis visualized.

Left Kidney: Length: 12.6 cm. Echogenicity within normal limits. No
mass or hydronephrosis visualized.

Abdominal aorta: No aneurysm visualized.

Other findings: None.
IMPRESSION: Normal abdominal ultrasound.

## 2022-03-09 ENCOUNTER — Inpatient Hospital Stay (HOSPITAL_COMMUNITY)
Admission: AD | Admit: 2022-03-09 | Discharge: 2022-03-09 | Disposition: A | Discharge status: Home / Self Care | Payer: Commercial Managed Care - HMO | Attending: Obstetrics & Gynecology | Admitting: Obstetrics & Gynecology

## 2022-03-09 ENCOUNTER — Encounter (HOSPITAL_COMMUNITY): Payer: Self-pay | Admitting: Obstetrics & Gynecology

## 2022-03-09 DIAGNOSIS — K219 Gastro-esophageal reflux disease without esophagitis: Secondary | ICD-10-CM

## 2022-03-09 DIAGNOSIS — R072 Precordial pain: Secondary | ICD-10-CM | POA: Insufficient documentation

## 2022-03-09 DIAGNOSIS — O99892 Other specified diseases and conditions complicating childbirth: Secondary | ICD-10-CM | POA: Insufficient documentation

## 2022-03-09 DIAGNOSIS — O99891 Other specified diseases and conditions complicating pregnancy: Secondary | ICD-10-CM

## 2022-03-09 DIAGNOSIS — Z3A24 24 weeks gestation of pregnancy: Secondary | ICD-10-CM | POA: Insufficient documentation

## 2022-03-09 DIAGNOSIS — R079 Chest pain, unspecified: Secondary | ICD-10-CM

## 2022-03-09 DIAGNOSIS — Z3689 Encounter for other specified antenatal screening: Secondary | ICD-10-CM

## 2022-03-09 DIAGNOSIS — R519 Headache, unspecified: Secondary | ICD-10-CM | POA: Diagnosis not present

## 2022-03-09 LAB — URINALYSIS, ROUTINE W REFLEX MICROSCOPIC
Bilirubin Urine: NEGATIVE
Glucose, UA: NEGATIVE mg/dL
Ketones, ur: NEGATIVE mg/dL
Leukocytes,Ua: NEGATIVE
Nitrite: NEGATIVE
Protein, ur: NEGATIVE mg/dL
Specific Gravity, Urine: 1.02 (ref 1.005–1.030)
pH: 7 (ref 5.0–8.0)

## 2022-03-09 LAB — COMPREHENSIVE METABOLIC PANEL
ALT: 11 U/L (ref 0–44)
AST: 14 U/L — ABNORMAL LOW (ref 15–41)
Albumin: 3 g/dL — ABNORMAL LOW (ref 3.5–5.0)
Alkaline Phosphatase: 57 U/L (ref 38–126)
Anion gap: 8 (ref 5–15)
BUN: 6 mg/dL (ref 6–20)
CO2: 22 mmol/L (ref 22–32)
Calcium: 9 mg/dL (ref 8.9–10.3)
Chloride: 105 mmol/L (ref 98–111)
Creatinine, Ser: 0.63 mg/dL (ref 0.44–1.00)
GFR, Estimated: >60 mL/min (ref >60)
Glucose, Bld: 89 mg/dL (ref 70–99)
Potassium: 3.3 mmol/L — ABNORMAL LOW (ref 3.5–5.1)
Sodium: 135 mmol/L (ref 135–145)
Total Bilirubin: 0.5 mg/dL (ref 0.3–1.2)
Total Protein: 6.9 g/dL (ref 6.5–8.1)

## 2022-03-09 LAB — CBC
HCT: 35.1 % — ABNORMAL LOW (ref 36.0–46.0)
Hemoglobin: 11.3 g/dL — ABNORMAL LOW (ref 12.0–15.0)
MCH: 29.7 pg (ref 26.0–34.0)
MCHC: 32.2 g/dL (ref 30.0–36.0)
MCV: 92.4 fL (ref 80.0–100.0)
Platelets: 232 10*3/uL (ref 150–400)
RBC: 3.8 MIL/uL — ABNORMAL LOW (ref 3.87–5.11)
RDW: 12.7 % (ref 11.5–15.5)
WBC: 8.6 10*3/uL (ref 4.0–10.5)
nRBC: 0 % (ref 0.0–0.2)

## 2022-03-09 LAB — PROTEIN / CREATININE RATIO, URINE
Creatinine, Urine: 186.26 mg/dL
Protein Creatinine Ratio: 0.07 mg/mg{Cre} (ref 0.00–0.15)
Total Protein, Urine: 13 mg/dL

## 2022-03-09 LAB — URINALYSIS, MICROSCOPIC (REFLEX)

## 2022-03-09 LAB — TROPONIN I (HIGH SENSITIVITY): Troponin I (High Sensitivity): 2 ng/L (ref <18)

## 2022-03-09 MED ORDER — PROCHLORPERAZINE EDISYLATE 10 MG/2ML IJ SOLN
10.0000 mg | Freq: Four times a day (QID) | INTRAMUSCULAR | Status: DC | PRN
  Administered 2022-03-09: 10 mg via INTRAVENOUS
  Filled 2022-03-09: qty 2

## 2022-03-09 MED ORDER — LACTATED RINGERS IV SOLN: Freq: Once | INTRAVENOUS | Status: AC

## 2022-03-09 MED ORDER — FAMOTIDINE 10 MG PO TABS: 10.0000 mg | ORAL_TABLET | Freq: Two times a day (BID) | ORAL | 0 refills | Status: DC

## 2022-03-09 MED ORDER — OMEPRAZOLE 20 MG PO CPDR: 20.0000 mg | DELAYED_RELEASE_CAPSULE | Freq: Every day | ORAL | 0 refills | Status: DC

## 2022-03-09 MED ORDER — PANTOPRAZOLE SODIUM 40 MG IV SOLR
40.0000 mg | Freq: Once | INTRAVENOUS | Status: AC
  Administered 2022-03-09: 40 mg via INTRAVENOUS
  Filled 2022-03-09: qty 10

## 2022-03-09 NOTE — MAU Provider Note (Signed)
?History  ?  ? ?CSN: [id number] ? ?Arrival date and time: 03/09/22 1130 ? ? Event Date/Time  ? First Provider Initiated Contact with Patient 03/09/22 1224   ?  ? ?Chief Complaint  ?Patient presents with  ? Chest Pain  ? Shortness of Breath  ? Fatigue  ? ?HPI ?Stacie Simpson is a 26 y.o. G3P2002 at 4817w6d who presents to MAU with chief complaint of chest pain. This is a new problem, onset last night. Pain is substernal mid-chest and radiates to patient's right subscapular area. She denies aggravating or alleviating factors.  Patient reports recurrent "exhaustion" exacerbated by her pervasive chest pain. She was not able to fall asleep last night.  She followed a normal routine, went to work, and as her symptoms continued her coworkers encouraged her to call her OB, who advised her to be evaluated in MAU. ? ?Patient also c/o headache. Pain score is 6/10.  Pain is anterior, bilateral and does not radiate. She denies spots in her field of vision. She has not taken medication for this complaint.  ? ?Patient reports history of GERD in her previous pregnancy. She has not yet experienced this complaint in her current pregnancy. Breakfast this morning included a sausage biscuit. She is open to being treated for reflux during her MAU encounter.  ? ?Patient denies vaginal bleeding, leaking of fluid, decreased fetal movement, fever, falls, or recent illness.  ? ? ?OB History   ? ? Gravida  ?3  ? Para  ?2  ? Term  ?2  ? Preterm  ?   ? AB  ?   ? Living  ?2  ?  ? ? SAB  ?   ? IAB  ?   ? Ectopic  ?   ? Multiple  ?0  ? Live Births  ?2  ?   ?  ?  ? ? ?Past Medical History:  ?Diagnosis Date  ? Anemia   ? Anxiety   ? Depression   ? GERD (gastroesophageal reflux disease)   ? Obesity   ? Pregnancy induced hypertension   ? TMJ (temporomandibular joint disorder)   ? Vision abnormalities   ? Pt wears contacts  ? ? ?Past Surgical History:  ?Procedure Laterality Date  ? PILONIDAL CYST / SINUS EXCISION    ? PILONIDAL CYST EXCISION    ? WISDOM  TOOTH EXTRACTION  2013  ? ? ?Family History  ?Problem Relation Age of Onset  ? Healthy Mother   ? Hypertension Father   ? Hypertension Maternal Grandmother   ? Hypertension Maternal Grandfather   ? Diabetes Maternal Grandfather   ? Heart disease Maternal Grandfather   ? Hypertension Paternal Grandmother   ? Hypertension Paternal Grandfather   ? Cancer Paternal Grandfather   ?     tongue  ? Heart disease Paternal Grandfather   ? ? ?Social History  ? ?Tobacco Use  ? Smoking status: Never  ? Smokeless tobacco: Never  ?Vaping Use  ? Vaping Use: Never used  ?Substance Use Topics  ? Alcohol use: Not Currently  ? Drug use: Not Currently  ?  Types: Marijuana  ? ? ?Allergies: No Known Allergies ? ?Medications Prior to Admission  ?Medication Sig Dispense Refill Last Dose  ? fexofenadine (ALLEGRA ALLERGY) 180 MG tablet Take 1 tablet (180 mg total) by mouth daily for 15 days. 15 tablet 0   ? loratadine (CLARITIN) 10 MG tablet Take 10 mg by mouth daily as needed for allergies.     ?  Prenatal Vit-Fe Fumarate-FA (PRENATAL MULTIVITAMIN) TABS tablet Take 1 tablet by mouth daily at 12 noon.   More than a month  ? ? ?Review of Systems  ?Constitutional:  Positive for fatigue.  ?Respiratory:  Positive for chest tightness and shortness of breath.   ?Neurological:  Positive for headaches.  ?All other systems reviewed and are negative. ?Physical Exam  ? ?Blood pressure 134/82, pulse (!) 106, temperature 98.5 ?F (36.9 ?C), temperature source Oral, resp. rate 20, height 5\' 9"  (1.753 m), weight 119.4 kg, last menstrual period 09/16/2021, SpO2 100 %, unknown if currently breastfeeding. ? ?Physical Exam ?Vitals and nursing note reviewed.  ?Constitutional:   ?   Appearance: She is well-developed. She is obese. She is not ill-appearing.  ?Cardiovascular:  ?   Rate and Rhythm: Normal rate.  ?   Pulses: Normal pulses.  ?   Heart sounds: Normal heart sounds.  ?Pulmonary:  ?   Effort: Pulmonary effort is normal.  ?   Breath sounds: Normal breath  sounds.  ?Skin: ?   Capillary Refill: Capillary refill takes less than 2 seconds.  ?Neurological:  ?   Mental Status: She is alert and oriented to person, place, and time.  ?Psychiatric:     ?   Mood and Affect: Mood normal.     ?   Behavior: Behavior normal.  ? ? ?MAU Course  ?Procedures ? ?MDM ? ?--Reactive tracing: baseline 140, mod var, + accels, no decels ? ?--Toco: UI ? ?--1350: CNM returned to bedside. VSS, patient ambulating independently to restroom. Patient verbalizes chest and subscapular pain have resolved. Feeling of SOB has resolved. Headache pain now 2/10. ? ?--1355: ROS, response to interventions reviewed with Dr. 4/10, Cardiologist. Agrees with my plan for Amb Referral to Cardio-Obstetrics. No additional interventions indicated at this time ? ?Orders Placed This Encounter  ?Procedures  ? Protein / creatinine ratio, urine  ? Urinalysis, Routine w reflex microscopic Urine, Clean Catch  ? CBC  ? Comprehensive metabolic panel  ? Urinalysis, Microscopic (reflex)  ? ED EKG  ? Insert peripheral IV  ? ?Patient Vitals for the past 24 hrs (Orthostatics in blue): ? BP Temp Temp src Pulse Resp SpO2 Height Weight  ?03/09/22 1401 ?Standing (!) 89/58 -- -- (!) 116 -- -- -- --  ?03/09/22 1400 Sitting 121/72 -- -- 93 -- 98 % -- --  ?03/09/22 1358  ?Lying 124/69 -- -- 93 -- -- -- --  ?03/09/22 1330 114/72 -- -- 96 -- 98 % -- --  ?03/09/22 1325 -- -- -- -- -- 98 % -- --  ?03/09/22 1320 -- -- -- -- -- 99 % -- --  ?03/09/22 1315 126/75 -- -- 95 -- 99 % -- --  ?03/09/22 1310 -- -- -- -- -- 99 % -- --  ?03/09/22 1308 133/82 -- -- 99 -- -- -- --  ?03/09/22 1300 122/80 -- -- (!) 102 -- 98 % -- --  ?03/09/22 1255 -- -- -- -- -- 98 % -- --  ?03/09/22 1250 -- -- -- -- -- 98 % -- --  ?03/09/22 1245 116/70 -- -- (!) 101 -- 98 % -- --  ?03/09/22 1240 -- -- -- -- -- 98 % -- --  ?03/09/22 1235 -- -- -- -- -- 98 % -- --  ?03/09/22 1230 136/78 -- -- (!) 104 -- 99 % -- --  ?03/09/22 1150 134/82 98.5 ?F (36.9 ?C) Oral (!) 106 20 100 %  5\' 9"  (1.753 m) 119.4 kg  ? ?Meds ordered  this encounter  ?Medications  ? prochlorperazine (COMPAZINE) injection 10 mg  ? lactated ringers infusion  ? pantoprazole (PROTONIX) injection 40 mg  ? omeprazole (PRILOSEC) 20 MG capsule  ?  Sig: Take 1 capsule (20 mg total) by mouth daily.  ?  Dispense:  30 capsule  ?  Refill:  0  ?  Order Specific Question:   Supervising Provider  ?  Answer:   ERVIN, MICHAEL L [[id number]]  ? famotidine (PEPCID) 10 MG tablet  ?  Sig: Take 1 tablet (10 mg total) by mouth 2 (two) times daily.  ?  Dispense:  60 tablet  ?  Refill:  0  ?  Order Specific Question:   Supervising Provider  ?  Answer:   ERVIN, MICHAEL L [[id number]]  ? ?Assessment and Plan  ?--26 y.o. G3P2002 at [redacted]w[redacted]d  ?--Reactive tracing ?--Chest pain, resolved with treatments in MAU ?--S/p Cardiology consult in MAU ?--Headache improved to 2/10 prior to discharge ?--Normotensive, PEC labs WNL ?--Discharge home in stable condition ? ?F/U: ?--Amb referral to Cardio-Obstetrics team ? ?Calvert Cantor, MSA, MSN, CNM ?03/09/2022, 4:39 PM  ?

## 2022-03-09 NOTE — MAU Note (Signed)
Stacie Simpson is a 26 y.o. at [redacted]w[redacted]d here in MAU reporting: noted last night was having some chest pain, radiating to upper back. Googled it.  Today at work, was still having pain and pressure- felt like heart was going to beat out of chest.  Checked her BP was 133/80, p98. 138/87 p113.  Called OB/GYN, was told to come here and get checked.  Also  fatigue, SOB, brain fog.  Hx of pre-E with first. ?Onset of complaint: yesterday ?Pain score: 6 ?Vitals:  ? 03/09/22 1150  ?BP: 134/82  ?Pulse: (!) 106  ?Resp: 20  ?Temp: 98.5 ?F (36.9 ?C)  ?SpO2: 100%  ?   ?FHT:146 ?Lab orders placed from triage:  urine ?

## 2022-03-30 NOTE — Progress Notes (Deleted)
Cardio[facility]Obstetrics Clinic  {Choose New Eval or Follow Up Note:[id number]}   Prior CV Studies Reviewed: The following studies were reviewed today: ***  Past Medical History:  Diagnosis Date   Anemia    Anxiety    Depression    GERD (gastroesophageal reflux disease)    Obesity    Pregnancy induced hypertension    TMJ (temporomandibular joint disorder)    Vision abnormalities    Pt wears contacts    Past Surgical History:  Procedure Laterality Date   PILONIDAL CYST / SINUS EXCISION     PILONIDAL CYST EXCISION     WISDOM TOOTH EXTRACTION  2013   { Click here to update PMH, PSH, OB Hx then refresh note  :1}   OB History     Gravida  3   Para  2   Term  2   Preterm      AB      Living  2      SAB      IAB      Ectopic      Multiple  0   Live Births  2           { Click here to update OB Charting then refresh note  :1}    Current Medications: No outpatient medications have been marked as taking for the 04/01/22 encounter (Appointment) with Meriam Sprague, MD.     Allergies:   Patient has no known allergies.   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 at [employer]  Tobacco Use   Smoking status: Never   Smokeless tobacco: Never  Vaping Use   Vaping Use: Never used  Substance and Sexual Activity   Alcohol use: Not Currently   Drug use: Not Currently    Types: Marijuana   Sexual activity: Yes    Partners: Female, Female    Comment: pregnant  Other Topics Concern   Not on file  Social History Narrative   Not on file   Social Determinants of Health   Financial Resource Strain: Not on file  Food Insecurity: Not on file  Transportation Needs: Not on file  Physical Activity: Not on file  Stress: Not on  file  Social Connections: Not on file  { Click here to update SDOH then refresh :1}    Family History  Problem Relation Age of Onset   Healthy Mother    Hypertension Father    Hypertension Maternal Grandmother    Hypertension Maternal Grandfather    Diabetes Maternal Grandfather    Heart disease Maternal Grandfather    Hypertension Paternal Grandmother    Hypertension Paternal Grandfather    Cancer Paternal Grandfather        tongue   Heart disease Paternal Grandfather    { Click here to update FH then refresh note    :1}   ROS:   Please see the history of present illness.    *** All other systems reviewed and are negative.   Labs/EKG Reviewed:    EKG:   EKG is *** ordered today.  The ekg ordered today demonstrates ***  Recent Labs: 03/09/2022: ALT 11; BUN 6; Creatinine, Ser 0.63; Hemoglobin 11.3; Platelets 232; Potassium 3.3;  Sodium 135   Recent Lipid Panel No results found for: CHOL, TRIG, HDL, CHOLHDL, LDLCALC, LDLDIRECT  Physical Exam:    VS:  LMP 09/16/2021     Wt Readings from Last 3 Encounters:  03/09/22 263 lb 4.8 oz (119.4 kg)  10/20/21 265 lb (120.2 kg)  05/13/21 276 lb 12.8 oz (125.6 kg)     GEN: *** Well nourished, well developed in no acute distress HEENT: Normal NECK: No JVD; No carotid bruits LYMPHATICS: No lymphadenopathy CARDIAC: ***RRR, no murmurs, rubs, gallops RESPIRATORY:  Clear to auscultation without rales, wheezing or rhonchi  ABDOMEN: Soft, non-tender, non-distended MUSCULOSKELETAL:  No edema; No deformity  SKIN: Warm and dry NEUROLOGIC:  Alert and oriented x 3 PSYCHIATRIC:  Normal affect    Risk Assessment/Risk Calculators:   { Click to calculate CARPREG II - THEN refresh note :1}    { Click to caclulate Mod WHO Class of CV Risk - THEN refresh note :1}     { Click for CHADS2VASc Score - THEN Refresh Note    :[id number]}      ASSESSMENT & PLAN:    *** There are no Patient Instructions on file for this  visit.   Dispo:  No follow-ups on file.   Medication Adjustments/Labs and Tests Ordered: Current medicines are reviewed at length with the patient today.  Concerns regarding medicines are outlined above.  Tests Ordered: No orders of the defined types were placed in this encounter.  Medication Changes: No orders of the defined types were placed in this encounter.

## 2022-04-01 ENCOUNTER — Encounter: Payer: Self-pay | Admitting: Cardiology

## 2022-04-01 ENCOUNTER — Ambulatory Visit: Payer: Managed Care, Other (non HMO) | Admitting: Cardiology

## 2022-04-01 ENCOUNTER — Ambulatory Visit (INDEPENDENT_AMBULATORY_CARE_PROVIDER_SITE_OTHER): Payer: Managed Care, Other (non HMO) | Admitting: Cardiology

## 2022-04-01 VITALS — BP 122/82 | HR 102 | Ht 69.0 in | Wt 262.0 lb

## 2022-04-01 DIAGNOSIS — Z8759 Personal history of other complications of pregnancy, childbirth and the puerperium: Secondary | ICD-10-CM | POA: Diagnosis not present

## 2022-04-01 DIAGNOSIS — R079 Chest pain, unspecified: Secondary | ICD-10-CM

## 2022-04-01 NOTE — Patient Instructions (Signed)
Medication Instructions:   Your physician recommends that you continue on your current medications as directed. Please refer to the Current Medication list given to you today.  *If you need a refill on your cardiac medications before your next appointment, please call your pharmacy*   Testing/Procedures:  Your physician has requested that you have an echocardiogram. Echocardiography is a painless test that uses sound waves to create images of your heart. It provides your doctor with information about the size and shape of your heart and how well your heart's chambers and valves are working. This procedure takes approximately one hour. There are no restrictions for this procedure.  Cardiac-OB patient: to be performed by [facility] or [facility].    Follow-Up:  AS NEEDED WITH DR. PEMBERTON [facility]    Important Information About Sugar        

## 2022-04-01 NOTE — Progress Notes (Signed)
?[facility] ? ?New Evaluation ? ?Date:  04/01/2022  ? ?ID:  YALINA PEAGLER, DOB Sep 20, 1996, MRN [id number] ? ?PCP:  Patient, No Pcp Per (Inactive) ?  ?[facility] Providers ?Cardiologist:  None  ?Electrophysiologist:  None      ? ?Referring MD: Derrek Gu*  ? ?Chief Complaint: chest pain ? ?History of Present Illness:   ? ?NAYELI DUBA is a 26 y.o. female [G3P2002] who is being seen today for the evaluation of chest pain at the request of Darlina Rumpf, C*.  ? ?Was seen in the MAU in 03/09/22 for chest pain. Trop negative. ECG with sinus tach with nonspecific ST changes. ? ?Today, the patient states she is currently [redacted] weeks pregnant. She has been feeling overall well, however, about 2 weeks ago she states that she was at work when she suddenly felt lightheaded and dizzy. She checked her BP and it was 130s/90s. She was having palpitations at that time. She called her OBGYN office who told her to go to the MAU for further evaluation. Work-up was reassuring as detailed above. ? ?Currently, the patient feels much better. No recurrent palpitations. Chest pain has resolved. No LE edema, orthopnea, PND. Feels SOB with laying flat but this has been ongoing. BP normally running 120s. No gestational diabetes. ? ?During her 1st pregnancy in 2015, she had pre-eclampsia. No issues during second pregnancy. ? ?Prior CV Studies Reviewed: ?The following studies were reviewed today: ?No CV studies ? ?Past Medical History:  ?Diagnosis Date  ? Anemia   ? Anxiety   ? Depression   ? GERD (gastroesophageal reflux disease)   ? Obesity   ? Pregnancy induced hypertension   ? TMJ (temporomandibular joint disorder)   ? Vision abnormalities   ? Pt wears contacts  ? ? ?Past Surgical History:  ?Procedure Laterality Date  ? PILONIDAL CYST / SINUS EXCISION    ? PILONIDAL CYST EXCISION    ? [place] EXTRACTION  2013  ?   ? ?OB History   ? ? Gravida  ?3  ? Para  ?2  ? Term  ?2  ? Preterm  ?   ? AB  ?   ?  Living  ?2  ?  ? ? SAB  ?   ? IAB  ?   ? Ectopic  ?   ? Multiple  ?0  ? Live Births  ?2  ?   ?  ?  ?    ? ? ?Current Medications: ?Current Meds  ?Medication Sig  ? famotidine (PEPCID) 10 MG tablet Take 1 tablet (10 mg total) by mouth 2 (two) times daily.  ? loratadine (CLARITIN) 10 MG tablet Take 10 mg by mouth daily as needed for allergies.  ? omeprazole (PRILOSEC) 20 MG capsule Take 1 capsule (20 mg total) by mouth daily.  ? Prenatal Vit-Fe Fumarate-FA (PRENATAL MULTIVITAMIN) TABS tablet Take 1 tablet by mouth daily at 12 noon.  ? valACYclovir (VALTREX) 500 MG tablet Take 500 mg by mouth as needed.  ?  ? ?Allergies:   Patient has no known allergies.  ? ?Social History  ? ?Socioeconomic History  ? Marital status: Married  ?  Spouse name: Not on file  ? Number of children: Not on file  ? Years of education: Not on file  ? Highest education level: Not on file  ?Occupational History  ? Occupation: Ship broker  ?  Comment: rising 12th grade at [employer]  ?Tobacco Use  ? Smoking status: Never  ?  Smokeless tobacco: Never  ?Vaping Use  ? Vaping Use: Never used  ?Substance and Sexual Activity  ? Alcohol use: Not Currently  ? Drug use: Not Currently  ?  Types: Marijuana  ? Sexual activity: Yes  ?  Partners: Female, Female  ?  Comment: pregnant  ?Other Topics Concern  ? Not on file  ?Social History Narrative  ? Not on file  ? ?Social Determinants of Health  ? ?Financial Resource Strain: Not on file  ?Food Insecurity: Not on file  ?Transportation Needs: Not on file  ?Physical Activity: Not on file  ?Stress: Not on file  ?Social Connections: Not on file  ?  ? ? ?Family History  ?Problem Relation Age of Onset  ? Healthy Mother   ? Hypertension Father   ? Hypertension Maternal Grandmother   ? Hypertension Maternal Grandfather   ? Diabetes Maternal Grandfather   ? Heart disease Maternal Grandfather   ? Hypertension Paternal Grandmother   ? Hypertension Paternal Grandfather   ? Cancer Paternal Grandfather   ?     tongue  ? Heart disease  Paternal Grandfather   ?   ? ?ROS:   ?Please see the history of present illness.    ?Review of Systems  ?Constitutional:  Negative for malaise/fatigue.  ?Respiratory:  Positive for shortness of breath.   ?Cardiovascular:  Positive for chest pain and palpitations. Negative for orthopnea, claudication, leg swelling and PND.  ?Gastrointestinal:  Negative for blood in stool and melena.  ?Genitourinary:  Negative for hematuria.  ?Musculoskeletal:  Negative for falls.  ?Neurological:  Positive for dizziness. Negative for loss of consciousness.   ? ? ?Labs/EKG Reviewed:   ? ?EKG:   ?EKG 03/09/22: Sinus tachycardia with nonspecific ST-T wave changes ? ?Recent Labs: ?03/09/2022: ALT 11; BUN 6; Creatinine, Ser 0.63; Hemoglobin 11.3; Platelets 232; Potassium 3.3; Sodium 135  ? ?Recent Lipid Panel ?No results found for: CHOL, TRIG, HDL, CHOLHDL, LDLCALC, LDLDIRECT ? ?Physical Exam:   ? ?VS:  BP 122/82   Pulse (!) 102   Ht 5\' 9"  (1.753 m)   Wt 262 lb (118.8 kg)   LMP 09/16/2021   SpO2 96%   BMI 38.69 kg/m?    ? ?Wt Readings from Last 3 Encounters:  ?04/01/22 262 lb (118.8 kg)  ?03/09/22 263 lb 4.8 oz (119.4 kg)  ?10/20/21 265 lb (120.2 kg)  ?  ? ?GEN:  Well nourished, well developed in no acute distress ?HEENT: Normal ?NECK: No JVD; No carotid bruits ?CARDIAC: Tachycardic, regular, no murmurs, rubs, gallops ?RESPIRATORY:  Clear to auscultation without rales, wheezing or rhonchi  ?ABDOMEN: Soft, non-tender, non-distended ?MUSCULOSKELETAL:  No edema; No deformity  ?SKIN: Warm and dry ?NEUROLOGIC:  Alert and oriented x 3 ?PSYCHIATRIC:  Normal affect  ? ? ?Risk Assessment/Risk Calculators:   ?{ ? ? ?ASSESSMENT & PLAN:   ? ?#Chest Pain: ?Patient presented to MAU in 02/2022 with lightheadedness and chest pain. Work-up there with normal troponin. ECG with sinus tachycardia but nonischemic. Symptoms improved with antiacid medication. Currently, she feels well with no recurrence of symptoms. We discussed the option of continued  surveillance vs obtaining TTE to evaluate further and she wishes to proceed with TTE at this time. ?-Check TTE ? ?#History of Pre-eclampsia: ?Occurred during first pregnancy. Did not have issues with second pregnancy and blood pressure has been well controlled during current pregnancy. She is high risk of recurrence and discussed importance of monitoring her BP at home.  ?-Continue ASA 81mg  daily ?-Monitor BP at  home  ? ?Patient Instructions  ?Medication Instructions:  ? ?Your physician recommends that you continue on your current medications as directed. Please refer to the Current Medication list given to you today. ? ?*If you need a refill on your cardiac medications before your next appointment, please call your pharmacy* ? ? ?Testing/Procedures: ? ?Your physician has requested that you have an echocardiogram. Echocardiography is a painless test that uses sound waves to create images of your heart. It provides your doctor with information about the size and shape of your heart and how well your heart?s chambers and valves are working. This procedure takes approximately one hour. There are no restrictions for this procedure.  Cardiac-OB patient: to be performed by [facility] or [facility].  ? ? ?Follow-Up: ? ?AS NEEDED WITH DR. Johney Frame HERE AT [place] ? ? ?Important Information About Sugar ? ? ? ? ? ? ? ? ?Dispo:  No follow-ups on file.  ? ?Medication Adjustments/Labs and Tests Ordered: ?Current medicines are reviewed at length with the patient today.  Concerns regarding medicines are outlined above.  ?Tests Ordered: ?Orders Placed This Encounter  ?Procedures  ? ECHOCARDIOGRAM COMPLETE  ? ?Medication Changes: ?No orders of the defined types were placed in this encounter. ?  ?

## 2022-04-15 ENCOUNTER — Ambulatory Visit (HOSPITAL_COMMUNITY): Payer: Commercial Managed Care - HMO

## 2022-04-15 ENCOUNTER — Telehealth (HOSPITAL_COMMUNITY): Payer: Self-pay | Admitting: Cardiology

## 2022-04-15 NOTE — Telephone Encounter (Signed)
Patient called and cancelled Echocardiogram scheduled for 04/15/22.  Patient did not wish to reschedule at this time and will call at a later date to reschedule. Order will be removed from the ECHO WQ and when pt calls back we will reinstate the order or create a new one. Thank you.  ?

## 2022-04-15 NOTE — Telephone Encounter (Signed)
Will send this message to Dr. Pemberton as a general FYI. 

## 2022-04-29 ENCOUNTER — Inpatient Hospital Stay (HOSPITAL_COMMUNITY)
Admission: AD | Admit: 2022-04-29 | Discharge: 2022-04-29 | Disposition: A | Discharge status: Home / Self Care | Payer: Commercial Managed Care - HMO | Attending: Obstetrics & Gynecology | Admitting: Obstetrics & Gynecology

## 2022-04-29 ENCOUNTER — Encounter (HOSPITAL_COMMUNITY): Payer: Self-pay

## 2022-04-29 DIAGNOSIS — O26893 Other specified pregnancy related conditions, third trimester: Secondary | ICD-10-CM

## 2022-04-29 DIAGNOSIS — R102 Pelvic and perineal pain: Secondary | ICD-10-CM

## 2022-04-29 DIAGNOSIS — Z3A32 32 weeks gestation of pregnancy: Secondary | ICD-10-CM

## 2022-04-29 DIAGNOSIS — O99213 Obesity complicating pregnancy, third trimester: Secondary | ICD-10-CM | POA: Diagnosis not present

## 2022-04-29 DIAGNOSIS — O99613 Diseases of the digestive system complicating pregnancy, third trimester: Secondary | ICD-10-CM | POA: Insufficient documentation

## 2022-04-29 LAB — URINALYSIS, ROUTINE W REFLEX MICROSCOPIC
Bilirubin Urine: NEGATIVE
Glucose, UA: NEGATIVE mg/dL
Hgb urine dipstick: NEGATIVE
Ketones, ur: NEGATIVE mg/dL
Leukocytes,Ua: NEGATIVE
Nitrite: NEGATIVE
Protein, ur: NEGATIVE mg/dL
Specific Gravity, Urine: 1.021 (ref 1.005–1.030)
pH: 7 (ref 5.0–8.0)

## 2022-04-29 MED ORDER — CYCLOBENZAPRINE HCL 5 MG PO TABS
10.0000 mg | ORAL_TABLET | Freq: Once | ORAL | Status: AC
  Administered 2022-04-29: 10 mg via ORAL
  Filled 2022-04-29: qty 2

## 2022-04-29 MED ORDER — CYCLOBENZAPRINE HCL 10 MG PO TABS: 10.0000 mg | ORAL_TABLET | Freq: Two times a day (BID) | ORAL | 0 refills | Status: DC | PRN

## 2022-04-29 NOTE — MAU Provider Note (Signed)
History     CSN: [id number]  Arrival date and time: 04/29/22 1148   Event Date/Time   First Provider Initiated Contact with Patient 04/29/22 1240      Chief Complaint  Patient presents with   Pelvic Pain   Vaginal Pressure   HPI  Ms. Stacie Simpson is a 26 y.o. year old G31P2002 female at [redacted]w[redacted]d weeks gestation who presents to MAU reporting constant pelvic pain and vaginal pressure since 2030 tonight after SI and with movement. She also endorses 1 episode of "pink" spotting after using the BR last night; none since then. She took Tylenol last night and this AM which lowered her pain; from 9/10 to 6/10. She receives [facility] with [facility]; next appt needs to be scheduled. Her spouse is present and contributing to the history taking.    OB History     Gravida  3   Para  2   Term  2   Preterm      AB      Living  2      SAB      IAB      Ectopic      Multiple  0   Live Births  2           Past Medical History:  Diagnosis Date   Anemia    Anxiety    Depression    GERD (gastroesophageal reflux disease)    Obesity    Pregnancy induced hypertension    TMJ (temporomandibular joint disorder)    Vision abnormalities    Pt wears contacts    Past Surgical History:  Procedure Laterality Date   PILONIDAL CYST / SINUS EXCISION     PILONIDAL CYST EXCISION     WISDOM TOOTH EXTRACTION  2013    Family History  Problem Relation Age of Onset   Healthy Mother    Hypertension Father    Hypertension Maternal Grandmother    Hypertension Maternal Grandfather    Diabetes Maternal Grandfather    Heart disease Maternal Grandfather    Hypertension Paternal Grandmother    Hypertension Paternal Grandfather    Cancer Paternal Grandfather        tongue   Heart disease Paternal Grandfather     Social History   Tobacco Use   Smoking status: Never   Smokeless tobacco: Never  Vaping Use   Vaping Use: Never used  Substance Use Topics   Alcohol use: Not  Currently   Drug use: Not Currently    Types: Marijuana    Allergies: No Known Allergies  Medications Prior to Admission  Medication Sig Dispense Refill Last Dose   aspirin EC 81 MG tablet Take 81 mg by mouth daily. Swallow whole.   04/28/2022   loratadine (CLARITIN) 10 MG tablet Take 10 mg by mouth daily as needed for allergies.   Past Week   Prenatal Vit-Fe Fumarate-FA (PRENATAL MULTIVITAMIN) TABS tablet Take 1 tablet by mouth daily at 12 noon.   04/28/2022   famotidine (PEPCID) 10 MG tablet Take 1 tablet (10 mg total) by mouth 2 (two) times daily. 60 tablet 0    fexofenadine (ALLEGRA ALLERGY) 180 MG tablet Take 1 tablet (180 mg total) by mouth daily for 15 days. 15 tablet 0    omeprazole (PRILOSEC) 20 MG capsule Take 1 capsule (20 mg total) by mouth daily. 30 capsule 0    valACYclovir (VALTREX) 500 MG tablet Take 500 mg by mouth as needed.  Review of Systems  Constitutional: Negative.   HENT: Negative.    Eyes: Negative.   Respiratory: Negative.    Cardiovascular: Negative.   Gastrointestinal: Negative.   Endocrine: Negative.   Genitourinary:  Positive for pelvic pain (increases with movement, resolves with lying down and is relieved by Tylenol) and vaginal pain (pressure; increases with movement).  Musculoskeletal: Negative.   Skin: Negative.   Allergic/Immunologic: Negative.   Neurological: Negative.   Hematological: Negative.   Psychiatric/Behavioral: Negative.    Physical Exam   Blood pressure 125/67, pulse (!) 112, temperature 98 F (36.7 C), temperature source Oral, resp. rate 17, height 5\' 10"  (1.778 m), weight 121.7 kg, last menstrual period 09/16/2021, SpO2 100 %, unknown if currently breastfeeding.  Physical Exam Vitals and nursing note reviewed. Exam conducted with a chaperone present.  Constitutional:      Appearance: Normal appearance. She is obese.  Cardiovascular:     Rate and Rhythm: Tachycardia present.  Pulmonary:     Effort: Pulmonary effort is  normal.  Abdominal:     Palpations: Abdomen is soft.  Genitourinary:    General: Normal vulva.     Comments: Dilation: Closed Effacement (%): Thick Cervical Position: Posterior Station: Ballotable Presentation: Undeterminable Exam by: 11/16/2021, CNM  Musculoskeletal:        General: Normal range of motion.     Cervical back: Normal range of motion.  Skin:    General: Skin is warm and dry.  Neurological:     Mental Status: She is alert and oriented to person, place, and time.  Psychiatric:        Mood and Affect: Mood normal.        Behavior: Behavior normal.        Thought Content: Thought content normal.        Judgment: Judgment normal.   REACTIVE NST - FHR: 135 bpm / moderate variability / accels present / decels absent / TOCO: none  MAU Course  Procedures  MDM CCUA SVE Flexeril 10 mg po -- resolved pain  Results for orders placed or performed during the hospital encounter of 04/29/22 (from the past 48 hour(s))  Urinalysis, Routine w reflex microscopic Urine, Clean Catch     Status: None   Collection Time: 04/29/22 11:53 AM  Result Value Ref Range   Color, Urine YELLOW YELLOW   APPearance CLEAR CLEAR   Specific Gravity, Urine 1.021 1.005 - 1.030   pH 7.0 5.0 - 8.0   Glucose, UA NEGATIVE NEGATIVE mg/dL   Hgb urine dipstick NEGATIVE NEGATIVE   Bilirubin Urine NEGATIVE NEGATIVE   Ketones, ur NEGATIVE NEGATIVE mg/dL   Protein, ur NEGATIVE NEGATIVE mg/dL   Nitrite NEGATIVE NEGATIVE   Leukocytes,Ua NEGATIVE NEGATIVE    Comment: Performed at [facility], [address] [place], [place]    Assessment and Plan  Pelvic pain affecting pregnancy in third trimester, antepartum  - Information provided on abdominal pain in pregnancy - Rx for Flexeril 10 mg BID or prn pain - Advised if no pain relief 2 hours after taking Flexeril, can take Tylenol 1000 mg for more severe pain  [redacted] weeks gestation of pregnancy   - Discharge patient - Call [facility] to  schedule next OB appt within 2 wks - Patient verbalized an understanding of the plan of care and agrees.    [id number], CNM 04/29/2022, 12:41 PM

## 2022-04-29 NOTE — Discharge Instructions (Signed)
You can take Flexeril 10 mg twice daily for pelvic and any other musculoskeletal pain. If it has not relieved your pain 2 hours after taking it, take Tylenol 1000 mg every 8 hours as needed

## 2022-04-29 NOTE — MAU Note (Signed)
...  Stacie Simpson is a 26 y.o. at [redacted]w[redacted]d here in MAU reporting: Constant pelvic pain and vaginal pressure since 2030 last night after having intercourse that is worse with movement. She reports she had one episode of light pink spotting after using the restroom last night but has not seen any since. +FM. No LOF.   She reports she took 1000 mg of Tylenol at 1000 and this brought her pain down to a 6 from a 9.  Onset of complaint: Last night at 2030.  Pain score:  6/10 pelvis 6/10 vagina  Lab orders placed from triage:  UA

## 2022-05-24 ENCOUNTER — Encounter (HOSPITAL_COMMUNITY): Payer: Commercial Managed Care - HMO

## 2022-06-07 LAB — OB RESULTS CONSOLE GBS: GBS: NEGATIVE

## 2022-06-08 ENCOUNTER — Other Ambulatory Visit: Payer: Self-pay | Admitting: Obstetrics & Gynecology

## 2022-06-08 ENCOUNTER — Telehealth (HOSPITAL_COMMUNITY): Payer: Self-pay | Admitting: *Deleted

## 2022-06-08 NOTE — Telephone Encounter (Signed)
Preadmission screen  

## 2022-06-10 ENCOUNTER — Encounter (HOSPITAL_COMMUNITY): Payer: Self-pay | Admitting: *Deleted

## 2022-06-10 ENCOUNTER — Encounter (HOSPITAL_COMMUNITY): Payer: Self-pay | Admitting: Obstetrics & Gynecology

## 2022-06-10 ENCOUNTER — Inpatient Hospital Stay (HOSPITAL_COMMUNITY)
Admission: AD | Admit: 2022-06-10 | Discharge: 2022-06-10 | Disposition: A | Discharge status: Home / Self Care | Payer: Commercial Managed Care - HMO | Attending: Obstetrics & Gynecology | Admitting: Obstetrics & Gynecology

## 2022-06-10 ENCOUNTER — Telehealth (HOSPITAL_COMMUNITY): Payer: Self-pay | Admitting: *Deleted

## 2022-06-10 DIAGNOSIS — Z3A38 38 weeks gestation of pregnancy: Secondary | ICD-10-CM | POA: Insufficient documentation

## 2022-06-10 DIAGNOSIS — O99613 Diseases of the digestive system complicating pregnancy, third trimester: Secondary | ICD-10-CM | POA: Diagnosis not present

## 2022-06-10 DIAGNOSIS — K644 Residual hemorrhoidal skin tags: Secondary | ICD-10-CM | POA: Diagnosis not present

## 2022-06-10 DIAGNOSIS — O2243 Hemorrhoids in pregnancy, third trimester: Secondary | ICD-10-CM | POA: Diagnosis present

## 2022-06-10 LAB — URINALYSIS, ROUTINE W REFLEX MICROSCOPIC
Bilirubin Urine: NEGATIVE
Glucose, UA: NEGATIVE mg/dL
Ketones, ur: 20 mg/dL — AB
Leukocytes,Ua: NEGATIVE
Nitrite: NEGATIVE
Protein, ur: NEGATIVE mg/dL
Specific Gravity, Urine: 1.02 (ref 1.005–1.030)
pH: 5 (ref 5.0–8.0)

## 2022-06-10 MED ORDER — DOCUSATE SODIUM 100 MG PO CAPS: 100.0000 mg | ORAL_CAPSULE | Freq: Two times a day (BID) | ORAL | 2 refills | Status: DC

## 2022-06-10 MED ORDER — HYDROCORT-PRAMOXINE (PERIANAL) 1-1 % EX FOAM
1.0000 | Freq: Three times a day (TID) | CUTANEOUS | 2 refills | Status: AC
Start: 2022-06-10 — End: 2022-06-20

## 2022-06-10 MED ORDER — LIDOCAINE 5 % EX OINT: 1.0000 | TOPICAL_OINTMENT | CUTANEOUS | 0 refills | Status: DC | PRN

## 2022-06-10 NOTE — MAU Note (Signed)
Per Dr. Charlotta Newton pt does not need fetal monitoring due to non OB complaint.

## 2022-06-10 NOTE — Telephone Encounter (Signed)
Preadmission screen  

## 2022-06-10 NOTE — MAU Note (Addendum)
Pt says  her EDC is 06-30-2022- That 06-23-2022 is when she is sch for an induction Pt says she has hx of hemm- has had 24 hrs- started yesterday after BM that was hard but did not strain  - used tucks, lidocaine spray, prep H , 3 sitz baths, Rx- steroid cream. Called office today - if cream don't work- then go to hospital Took XS Tyl 2 tabs 4pm- no relief

## 2022-06-10 NOTE — MAU Provider Note (Signed)
History     [id number]  Arrival date and time: 06/10/22 1933    Chief Complaint  Patient presents with   Hemorrhoids     HPI Stacie Simpson is a 26 y.o. at [redacted]w[redacted]d who presents for hemorrhoid.  States she is in excruciating pain to the point where she cannot sit.  Pain started yesterday after hard BM.  Denies issues with hemorrhoids previously.  Denies issues with constipation.  She was recently prescribed topical steroid cream by OB, but has not started using this quite yet.  Denies contractions, vaginal bleeding or LOF.  +fetal movement.   OB History     Gravida  3   Para  2   Term  2   Preterm      AB      Living  2      SAB      IAB      Ectopic      Multiple  0   Live Births  2           Past Medical History:  Diagnosis Date   Anemia    Anxiety    Depression    GERD (gastroesophageal reflux disease)    HSV-2 infection    Obesity    Pregnancy induced hypertension    TMJ (temporomandibular joint disorder)    Vision abnormalities    Pt wears contacts    Past Surgical History:  Procedure Laterality Date   PILONIDAL CYST / SINUS EXCISION     PILONIDAL CYST EXCISION     WISDOM TOOTH EXTRACTION  2013    Family History  Problem Relation Age of Onset   Healthy Mother    Hypertension Father    Hypertension Maternal Grandmother    Hypertension Maternal Grandfather    Diabetes Maternal Grandfather    Heart disease Maternal Grandfather    Hypertension Paternal Grandmother    Hypertension Paternal Grandfather    Cancer Paternal Grandfather        tongue   Heart disease Paternal Grandfather     No Known Allergies  No current facility-administered medications on file prior to encounter.   Current Outpatient Medications on File Prior to Encounter  Medication Sig Dispense Refill   valACYclovir (VALTREX) 500 MG tablet Take 500 mg by mouth as needed.     aspirin EC 81 MG tablet Take 81 mg by mouth daily. Swallow whole.     cyclobenzaprine  (FLEXERIL) 10 MG tablet Take 1 tablet (10 mg total) by mouth 2 (two) times daily as needed for muscle spasms. 60 tablet 0   famotidine (PEPCID) 10 MG tablet Take 1 tablet (10 mg total) by mouth 2 (two) times daily. 60 tablet 0   fexofenadine (ALLEGRA ALLERGY) 180 MG tablet Take 1 tablet (180 mg total) by mouth daily for 15 days. 15 tablet 0   loratadine (CLARITIN) 10 MG tablet Take 10 mg by mouth daily as needed for allergies.     omeprazole (PRILOSEC) 20 MG capsule Take 1 capsule (20 mg total) by mouth daily. 30 capsule 0   Prenatal Vit-Fe Fumarate-FA (PRENATAL MULTIVITAMIN) TABS tablet Take 1 tablet by mouth daily at 12 noon.     [DISCONTINUED] fluticasone (FLONASE) 50 MCG/ACT nasal spray Place 1-2 sprays into both nostrils daily as needed for allergies or rhinitis.       Review of Systems  Constitutional: Negative.   HENT: Negative.    Respiratory: Negative.    Cardiovascular: Negative.   Gastrointestinal: Negative.  Genitourinary: Negative.   Musculoskeletal: Negative.   Skin: Negative.   Neurological: Negative.   Endo/Heme/Allergies: Negative.   Psychiatric/Behavioral: Negative.     Pertinent positives and negative per HPI, all others reviewed and negative  Physical Exam   BP 118/68   Pulse (!) 104   Temp 99.5 F (37.5 C) (Oral)   Resp 20   Ht 5\' 9"  (1.753 m)   Wt 121.1 kg   LMP 09/16/2021   BMI 39.41 kg/m   Patient Vitals for the past 24 hrs:  BP Temp Temp src Pulse Resp Height Weight  06/10/22 2045 118/68 -- -- (!) 104 -- -- --  06/10/22 2007 122/81 99.5 F (37.5 C) Oral (!) 107 20 5\' 9"  (1.753 m) 121.1 kg    Physical Exam Constitutional:      Appearance: Normal appearance.  Pulmonary:     Breath sounds: Normal breath sounds.  Abdominal:     Comments: Gravid, non-tender  Genitourinary:    Comments: 1.5cm hemorrhoid noted- reduced without difficulty Musculoskeletal:        General: No tenderness.  Skin:    General: Skin is warm and dry.  Neurological:      Mental Status: She is alert and oriented to person, place, and time.  Psychiatric:        Mood and Affect: Mood normal.        Behavior: Behavior normal.      FHT:   Labs Results for orders placed or performed during the hospital encounter of 06/10/22 (from the past 24 hour(s))  Urinalysis, Routine w reflex microscopic Urine, Clean Catch     Status: Abnormal   Collection Time: 06/10/22  8:29 PM  Result Value Ref Range   Color, Urine YELLOW YELLOW   APPearance HAZY (A) CLEAR   Specific Gravity, Urine 1.020 1.005 - 1.030   pH 5.0 5.0 - 8.0   Glucose, UA NEGATIVE NEGATIVE mg/dL   Hgb urine dipstick SMALL (A) NEGATIVE   Bilirubin Urine NEGATIVE NEGATIVE   Ketones, ur 20 (A) NEGATIVE mg/dL   Protein, ur NEGATIVE NEGATIVE mg/dL   Nitrite NEGATIVE NEGATIVE   Leukocytes,Ua NEGATIVE NEGATIVE   RBC / HPF 0-5 0 - 5 RBC/hpf   WBC, UA 0-5 0 - 5 WBC/hpf   Bacteria, UA RARE (A) NONE SEEN   Squamous Epithelial / LPF 0-5 0 - 5   Mucus PRESENT     Imaging No results found.  MAU Course  Procedures Lab Orders         Urinalysis, Routine w reflex microscopic Urine, Clean Catch     Meds ordered this encounter  Medications   lidocaine (XYLOCAINE) 5 % ointment    Sig: Apply 1 Application topically as needed.    Dispense:  35.44 g    Refill:  0   hydrocortisone-pramoxine (PROCTOFOAM-HC) rectal foam    Sig: Place 1 applicator rectally 3 (three) times daily for 10 days.    Dispense:  8.2 g    Refill:  2   docusate sodium (COLACE) 100 MG capsule    Sig: Take 1 capsule (100 mg total) by mouth 2 (two) times daily.    Dispense:  60 capsule    Refill:  2   Imaging Orders  No imaging studies ordered today    MDM External hemorrhoid reduced without issue Briefly discussed further intervention though in 3rd trimester- likely to note improvement s/p delivery   Assessment and Plan   1. External hemorrhoid   2. [redacted] weeks gestation  of pregnancy    -Rx sent in for proctofoam, local  lidocaine -encourage stool softener daily -pt to take Sitz bath at least twice daily -concern for possible thrombosed hemorrhoid- may consider elective IOL to see if improved s/p delivery -pt to f/u at scheduled OB appointment  Myna Hidalgo, DO Attending Obstetrician & Gynecologist, [facility] for [facility], [facility]

## 2022-06-21 ENCOUNTER — Inpatient Hospital Stay (EMERGENCY_DEPARTMENT_HOSPITAL)
Admission: AD | Admit: 2022-06-21 | Discharge: 2022-06-22 | Disposition: A | Discharge status: Home / Self Care | Payer: Medicaid Other | Source: Home / Self Care | Attending: Obstetrics and Gynecology | Admitting: Obstetrics and Gynecology

## 2022-06-21 DIAGNOSIS — O26893 Other specified pregnancy related conditions, third trimester: Secondary | ICD-10-CM | POA: Insufficient documentation

## 2022-06-21 DIAGNOSIS — Z3A39 39 weeks gestation of pregnancy: Secondary | ICD-10-CM | POA: Insufficient documentation

## 2022-06-21 DIAGNOSIS — O471 False labor at or after 37 completed weeks of gestation: Secondary | ICD-10-CM | POA: Insufficient documentation

## 2022-06-21 DIAGNOSIS — O479 False labor, unspecified: Secondary | ICD-10-CM

## 2022-06-21 NOTE — MAU Note (Signed)
.  Stacie Simpson is a 26 y.o. at [redacted]w[redacted]d here in MAU reporting ctxs since 1700. Some pelvic pressure and lower back pain. Thinks may have been leaking fld off and on since 1700. Good FM. 3cm last sve  Onset of complaint: 1700 Pain score: 7 Vitals:   06/21/22 2351  BP: 127/81  Pulse: 99  Resp: 17  Temp: 98.4 F (36.9 C)  SpO2: 100%     FHT:130 Lab orders placed from triage:  labor eval for mau

## 2022-06-22 ENCOUNTER — Other Ambulatory Visit: Payer: Self-pay

## 2022-06-22 ENCOUNTER — Encounter (HOSPITAL_COMMUNITY): Payer: Self-pay | Admitting: Obstetrics and Gynecology

## 2022-06-22 DIAGNOSIS — O479 False labor, unspecified: Secondary | ICD-10-CM

## 2022-06-22 DIAGNOSIS — Z3A39 39 weeks gestation of pregnancy: Secondary | ICD-10-CM

## 2022-06-22 DIAGNOSIS — O99214 Obesity complicating childbirth: Secondary | ICD-10-CM | POA: Diagnosis not present

## 2022-06-22 NOTE — MAU Provider Note (Signed)
Chief Complaint:  Contractions   Event Date/Time   First Provider Initiated Contact with Patient 06/22/22 0017     HPI: Stacie Simpson is a 26 y.o. G3P2002 at 18w6dwho presents to maternity admissions reporting uterine contractions and leaking of fluid.  . Last exam was 3cm She reports good fetal movement, denies vaginal bleeding, vaginal itching/burning, urinary symptoms, h/a, dizziness, n/v, diarrhea, constipation or fever/chills.    Abdominal Pain The current episode started today. The problem occurs intermittently. The problem has been unchanged. The quality of the pain is cramping. Pertinent negatives include no fever, myalgias, nausea or vomiting. Nothing aggravates the pain. The pain is relieved by Nothing. She has tried nothing for the symptoms.  Vaginal Discharge The patient's primary symptoms include pelvic pain and vaginal discharge. The patient's pertinent negatives include no genital itching, genital lesions, genital odor or vaginal bleeding. This is a new problem. The current episode started today. She is pregnant. Associated symptoms include abdominal pain. Pertinent negatives include no fever, nausea or vomiting. The vaginal discharge was clear and watery. There has been no bleeding. She has not been passing clots. She has not been passing tissue. Nothing aggravates the symptoms. She has tried nothing for the symptoms.    Past Medical History: Past Medical History:  Diagnosis Date   Anemia    Anxiety    Depression    GERD (gastroesophageal reflux disease)    HSV-2 infection    Obesity    Pregnancy induced hypertension    TMJ (temporomandibular joint disorder)    Vision abnormalities    Pt wears contacts    Past obstetric history: OB History  Gravida Para Term Preterm AB Living  3 2 2     2   SAB IAB Ectopic Multiple Live Births        0 2    # Outcome Date GA Lbr Len/2nd Weight Sex Delivery Anes PTL Lv  3 Current           2 Term 05/14/21 [redacted]w[redacted]d / 00:28 3841 g F  Vag-Spont EPI  LIV     Birth Comments: wnl  1 Term 10/22/14 [redacted]w[redacted]d 17:44 / 00:23 3830 g M Vag-Spont EPI  LIV     Birth Comments: WNL    Past Surgical History: Past Surgical History:  Procedure Laterality Date   PILONIDAL CYST / SINUS EXCISION     PILONIDAL CYST EXCISION     WISDOM TOOTH EXTRACTION  2013    Family History: Family History  Problem Relation Age of Onset   Healthy Mother    Hypertension Father    Hypertension Maternal Grandmother    Hypertension Maternal Grandfather    Diabetes Maternal Grandfather    Heart disease Maternal Grandfather    Hypertension Paternal Grandmother    Hypertension Paternal Grandfather    Cancer Paternal Grandfather        tongue   Heart disease Paternal Grandfather     Social History: Social History   Tobacco Use   Smoking status: Never   Smokeless tobacco: Never  Vaping Use   Vaping Use: Never used  Substance Use Topics   Alcohol use: Not Currently   Drug use: Not Currently    Types: Marijuana    Allergies: No Known Allergies  Meds:  Medications Prior to Admission  Medication Sig Dispense Refill Last Dose   aspirin EC 81 MG tablet Take 81 mg by mouth daily. Swallow whole.      cyclobenzaprine (FLEXERIL) 10 MG tablet Take 1 tablet (  10 mg total) by mouth 2 (two) times daily as needed for muscle spasms. 60 tablet 0    docusate sodium (COLACE) 100 MG capsule Take 1 capsule (100 mg total) by mouth 2 (two) times daily. 60 capsule 2    famotidine (PEPCID) 10 MG tablet Take 1 tablet (10 mg total) by mouth 2 (two) times daily. 60 tablet 0    fexofenadine (ALLEGRA ALLERGY) 180 MG tablet Take 1 tablet (180 mg total) by mouth daily for 15 days. 15 tablet 0    lidocaine (XYLOCAINE) 5 % ointment Apply 1 Application topically as needed. 35.44 g 0    loratadine (CLARITIN) 10 MG tablet Take 10 mg by mouth daily as needed for allergies.      omeprazole (PRILOSEC) 20 MG capsule Take 1 capsule (20 mg total) by mouth daily. 30 capsule 0     Prenatal Vit-Fe Fumarate-FA (PRENATAL MULTIVITAMIN) TABS tablet Take 1 tablet by mouth daily at 12 noon.      valACYclovir (VALTREX) 500 MG tablet Take 500 mg by mouth as needed.       I have reviewed patient's Past Medical Hx, Surgical Hx, Family Hx, Social Hx, medications and allergies.   ROS:  Review of Systems  Constitutional:  Negative for fever.  Gastrointestinal:  Positive for abdominal pain. Negative for nausea and vomiting.  Genitourinary:  Positive for pelvic pain and vaginal discharge.  Musculoskeletal:  Negative for myalgias.   Other systems negative  Physical Exam  Patient Vitals for the past 24 hrs:  BP Temp Pulse Resp SpO2 Height Weight  06/21/22 2351 127/81 98.4 F (36.9 C) 99 17 100 % 5\' 9"  (1.753 m) 121.1 kg   Constitutional: Well-developed, well-nourished female in no acute distress.  Cardiovascular: normal rate and rhythm Respiratory: normal effort, clear to auscultation bilaterally GI: Abd soft, non-tender, gravid appropriate for gestational age.   No rebound or guarding. MS: Extremities nontender, no edema, normal ROM Neurologic: Alert and oriented x 4.  GU: Neg CVAT.  PELVIC EXAM: Cervix pink, visually closed, without lesion, scant white creamy discharge, vaginal walls and external genitalia normal    No Pooling, No ferning.  Dilation: 3 Effacement (%): 40 Station: -3 Presentation: Vertex Exam by:: [email or web address], CNM  No change after 1.5 hrs  FHT:  Baseline 130 , moderate variability, accelerations present, no decelerations Contractions:  Irregular     Labs: No results found for this or any previous visit (from the past 24 hour(s)). O/Positive/-- (01/25 0000)  Imaging:  No results found.  MAU Course/MDM: I have reviewed the triage vital signs and the nursing notes.   Pertinent labs & imaging results that were available during my care of the patient were reviewed by me and considered in my medical decision making (see chart for details).       I have reviewed her medical records including past results, notes and treatments.   NST reviewed, reactive  Treatments in MAU included EFM, Sterile Speculum Exam, negative for ruptured membranes. .    Assessment: Single IUP at [redacted]w[redacted]d Uterine contractions, prodromal  Vaginal discharge, no evidence of ruptured membranes  Plan: Discharge home Labor precautions and fetal kick counts Follow up in Office for prenatal visits and recheck Encouraged to return if she develops worsening of symptoms, increase in pain, fever, or other concerning symptoms.  Pt stable at time of discharge.  [redacted]w[redacted]d CNM, MSN Certified Nurse-Midwife 06/22/2022 12:17 AM

## 2022-06-23 ENCOUNTER — Inpatient Hospital Stay (HOSPITAL_COMMUNITY): Payer: Medicaid Other | Admitting: Anesthesiology

## 2022-06-23 ENCOUNTER — Inpatient Hospital Stay (HOSPITAL_COMMUNITY)
Admission: AD | Admit: 2022-06-23 | Discharge: 2022-06-24 | DRG: 806 | Disposition: A | Discharge status: Home / Self Care | Payer: Medicaid Other | Attending: Obstetrics & Gynecology | Admitting: Obstetrics & Gynecology

## 2022-06-23 ENCOUNTER — Encounter (HOSPITAL_COMMUNITY): Payer: Self-pay | Admitting: Obstetrics & Gynecology

## 2022-06-23 ENCOUNTER — Inpatient Hospital Stay (HOSPITAL_COMMUNITY): Payer: Medicaid Other

## 2022-06-23 DIAGNOSIS — Z3A40 40 weeks gestation of pregnancy: Secondary | ICD-10-CM

## 2022-06-23 DIAGNOSIS — A6000 Herpesviral infection of urogenital system, unspecified: Secondary | ICD-10-CM | POA: Diagnosis present

## 2022-06-23 DIAGNOSIS — Z3A39 39 weeks gestation of pregnancy: Secondary | ICD-10-CM | POA: Diagnosis not present

## 2022-06-23 DIAGNOSIS — Z7982 Long term (current) use of aspirin: Secondary | ICD-10-CM

## 2022-06-23 DIAGNOSIS — Z6838 Body mass index (BMI) 38.0-38.9, adult: Secondary | ICD-10-CM

## 2022-06-23 DIAGNOSIS — O9962 Diseases of the digestive system complicating childbirth: Secondary | ICD-10-CM | POA: Diagnosis present

## 2022-06-23 DIAGNOSIS — O9832 Other infections with a predominantly sexual mode of transmission complicating childbirth: Secondary | ICD-10-CM | POA: Diagnosis present

## 2022-06-23 DIAGNOSIS — E669 Obesity, unspecified: Secondary | ICD-10-CM

## 2022-06-23 DIAGNOSIS — O09299 Supervision of pregnancy with other poor reproductive or obstetric history, unspecified trimester: Secondary | ICD-10-CM

## 2022-06-23 DIAGNOSIS — K219 Gastro-esophageal reflux disease without esophagitis: Secondary | ICD-10-CM | POA: Diagnosis present

## 2022-06-23 DIAGNOSIS — O99214 Obesity complicating childbirth: Principal | ICD-10-CM | POA: Diagnosis present

## 2022-06-23 DIAGNOSIS — O479 False labor, unspecified: Secondary | ICD-10-CM | POA: Diagnosis not present

## 2022-06-23 LAB — CBC
HCT: 31.2 % — ABNORMAL LOW (ref 36.0–46.0)
Hemoglobin: 10.3 g/dL — ABNORMAL LOW (ref 12.0–15.0)
MCH: 31 pg (ref 26.0–34.0)
MCHC: 33 g/dL (ref 30.0–36.0)
MCV: 94 fL (ref 80.0–100.0)
Platelets: 197 10*3/uL (ref 150–400)
RBC: 3.32 MIL/uL — ABNORMAL LOW (ref 3.87–5.11)
RDW: 12.7 % (ref 11.5–15.5)
WBC: 9.7 10*3/uL (ref 4.0–10.5)
nRBC: 0 % (ref 0.0–0.2)

## 2022-06-23 LAB — TYPE AND SCREEN
ABO/RH(D): O POS
Antibody Screen: NEGATIVE

## 2022-06-23 LAB — RPR: RPR Ser Ql: NONREACTIVE

## 2022-06-23 MED ORDER — OXYTOCIN-SODIUM CHLORIDE 30-0.9 UT/500ML-% IV SOLN
1.0000 m[IU]/min | INTRAVENOUS | Status: DC
  Administered 2022-06-23: 1 m[IU]/min via INTRAVENOUS
  Filled 2022-06-23: qty 500

## 2022-06-23 MED ORDER — SENNOSIDES-DOCUSATE SODIUM 8.6-50 MG PO TABS
2.0000 | ORAL_TABLET | Freq: Every day | ORAL | Status: DC
  Administered 2022-06-24: 2 via ORAL
  Filled 2022-06-23: qty 2

## 2022-06-23 MED ORDER — ONDANSETRON HCL 4 MG/2ML IJ SOLN
4.0000 mg | INTRAMUSCULAR | Status: DC | PRN
  Administered 2022-06-23: 4 mg via INTRAVENOUS
  Filled 2022-06-23: qty 2

## 2022-06-23 MED ORDER — WITCH HAZEL-GLYCERIN EX PADS: 1.0000 | MEDICATED_PAD | CUTANEOUS | Status: DC | PRN

## 2022-06-23 MED ORDER — PHENYLEPHRINE 80 MCG/ML (10ML) SYRINGE FOR IV PUSH (FOR BLOOD PRESSURE SUPPORT): 80.0000 ug | PREFILLED_SYRINGE | INTRAVENOUS | Status: DC | PRN

## 2022-06-23 MED ORDER — MISOPROSTOL 25 MCG QUARTER TABLET: 25.0000 ug | ORAL_TABLET | ORAL | Status: DC | PRN

## 2022-06-23 MED ORDER — LACTATED RINGERS IV SOLN: INTRAVENOUS | Status: DC

## 2022-06-23 MED ORDER — FENTANYL-BUPIVACAINE-NACL 0.5-0.125-0.9 MG/250ML-% EP SOLN
12.0000 mL/h | EPIDURAL | Status: DC | PRN
  Administered 2022-06-23: 12 mL/h via EPIDURAL
  Filled 2022-06-23: qty 250

## 2022-06-23 MED ORDER — ONDANSETRON HCL 4 MG PO TABS: 4.0000 mg | ORAL_TABLET | ORAL | Status: DC | PRN

## 2022-06-23 MED ORDER — ACETAMINOPHEN 325 MG PO TABS: 650.0000 mg | ORAL_TABLET | ORAL | Status: DC | PRN

## 2022-06-23 MED ORDER — OXYTOCIN BOLUS FROM INFUSION
333.0000 mL | Freq: Once | INTRAVENOUS | Status: AC
Start: 2022-06-23 — End: 2022-06-23
  Administered 2022-06-23: 333 mL via INTRAVENOUS

## 2022-06-23 MED ORDER — OXYTOCIN-SODIUM CHLORIDE 30-0.9 UT/500ML-% IV SOLN: 1.0000 m[IU]/min | INTRAVENOUS | Status: DC

## 2022-06-23 MED ORDER — TRANEXAMIC ACID-NACL 1000-0.7 MG/100ML-% IV SOLN
1000.0000 mg | INTRAVENOUS | Status: AC
Start: 2022-06-23 — End: 2022-06-23
  Administered 2022-06-23: 1000 mg via INTRAVENOUS

## 2022-06-23 MED ORDER — DIPHENHYDRAMINE HCL 50 MG/ML IJ SOLN: 12.5000 mg | INTRAMUSCULAR | Status: DC | PRN

## 2022-06-23 MED ORDER — PRENATAL MULTIVITAMIN CH
1.0000 | ORAL_TABLET | Freq: Every day | ORAL | Status: DC
  Administered 2022-06-24: 1 via ORAL
  Filled 2022-06-23: qty 1

## 2022-06-23 MED ORDER — LACTATED RINGERS IV SOLN: 500.0000 mL | Freq: Once | INTRAVENOUS | Status: DC

## 2022-06-23 MED ORDER — OXYCODONE-ACETAMINOPHEN 5-325 MG PO TABS: 2.0000 | ORAL_TABLET | ORAL | Status: DC | PRN

## 2022-06-23 MED ORDER — EPHEDRINE 5 MG/ML INJ: 10.0000 mg | INTRAVENOUS | Status: DC | PRN

## 2022-06-23 MED ORDER — ZOLPIDEM TARTRATE 5 MG PO TABS: 5.0000 mg | ORAL_TABLET | Freq: Every evening | ORAL | Status: DC | PRN

## 2022-06-23 MED ORDER — TRANEXAMIC ACID-NACL 1000-0.7 MG/100ML-% IV SOLN
INTRAVENOUS | Status: AC
  Filled 2022-06-23: qty 100

## 2022-06-23 MED ORDER — ACETAMINOPHEN 325 MG PO TABS
650.0000 mg | ORAL_TABLET | ORAL | Status: DC | PRN
  Administered 2022-06-23 – 2022-06-24 (×2): 650 mg via ORAL
  Filled 2022-06-23 (×2): qty 2

## 2022-06-23 MED ORDER — COCONUT OIL OIL: 1.0000 | TOPICAL_OIL | Status: DC | PRN

## 2022-06-23 MED ORDER — TERBUTALINE SULFATE 1 MG/ML IJ SOLN: 0.2500 mg | Freq: Once | INTRAMUSCULAR | Status: DC | PRN

## 2022-06-23 MED ORDER — LIDOCAINE HCL (PF) 1 % IJ SOLN
INTRAMUSCULAR | Status: DC | PRN
  Administered 2022-06-23: 11 mL via EPIDURAL

## 2022-06-23 MED ORDER — BENZOCAINE-MENTHOL 20-0.5 % EX AERO
1.0000 | INHALATION_SPRAY | CUTANEOUS | Status: DC | PRN
  Administered 2022-06-23: 1 via TOPICAL
  Filled 2022-06-23: qty 56

## 2022-06-23 MED ORDER — LACTATED RINGERS IV SOLN: 500.0000 mL | INTRAVENOUS | Status: DC | PRN

## 2022-06-23 MED ORDER — LORATADINE 10 MG PO TABS
10.0000 mg | ORAL_TABLET | Freq: Every day | ORAL | Status: DC | PRN
Start: 2022-06-23 — End: 2022-06-24

## 2022-06-23 MED ORDER — LIDOCAINE HCL (PF) 1 % IJ SOLN: 30.0000 mL | INTRAMUSCULAR | Status: DC | PRN

## 2022-06-23 MED ORDER — OXYTOCIN-SODIUM CHLORIDE 30-0.9 UT/500ML-% IV SOLN: 2.5000 [IU]/h | INTRAVENOUS | Status: DC

## 2022-06-23 MED ORDER — TETANUS-DIPHTH-ACELL PERTUSSIS 5-2.5-18.5 LF-MCG/0.5 IM SUSY: 0.5000 mL | PREFILLED_SYRINGE | Freq: Once | INTRAMUSCULAR | Status: DC

## 2022-06-23 MED ORDER — METHYLERGONOVINE MALEATE 0.2 MG/ML IJ SOLN
0.2000 mg | Freq: Once | INTRAMUSCULAR | Status: AC
  Administered 2022-06-23: 0.2 mg via INTRAMUSCULAR

## 2022-06-23 MED ORDER — IBUPROFEN 600 MG PO TABS
600.0000 mg | ORAL_TABLET | Freq: Four times a day (QID) | ORAL | Status: DC
  Administered 2022-06-23 – 2022-06-24 (×4): 600 mg via ORAL
  Filled 2022-06-23 (×4): qty 1

## 2022-06-23 MED ORDER — DIBUCAINE (PERIANAL) 1 % EX OINT: 1.0000 | TOPICAL_OINTMENT | CUTANEOUS | Status: DC | PRN

## 2022-06-23 MED ORDER — DIPHENHYDRAMINE HCL 25 MG PO CAPS: 25.0000 mg | ORAL_CAPSULE | Freq: Four times a day (QID) | ORAL | Status: DC | PRN

## 2022-06-23 MED ORDER — OXYCODONE-ACETAMINOPHEN 5-325 MG PO TABS
1.0000 | ORAL_TABLET | ORAL | Status: DC | PRN
  Administered 2022-06-24: 1 via ORAL
  Filled 2022-06-23: qty 1

## 2022-06-23 MED ORDER — ONDANSETRON HCL 4 MG/2ML IJ SOLN: 4.0000 mg | Freq: Four times a day (QID) | INTRAMUSCULAR | Status: DC | PRN

## 2022-06-23 MED ORDER — SIMETHICONE 80 MG PO CHEW: 80.0000 mg | CHEWABLE_TABLET | ORAL | Status: DC | PRN

## 2022-06-23 MED ORDER — SOD CITRATE-CITRIC ACID 500-334 MG/5ML PO SOLN: 30.0000 mL | ORAL | Status: DC | PRN

## 2022-06-23 MED ORDER — METHYLERGONOVINE MALEATE 0.2 MG/ML IJ SOLN
INTRAMUSCULAR | Status: AC
  Filled 2022-06-23: qty 1

## 2022-06-23 NOTE — Progress Notes (Signed)
MD LABOR PROGRESS NOTE  Stacie Simpson is a 26 y.o. G3P2002 at [redacted]w[redacted]d admitted for induction of labor due to Elective at term.  Subjective: Patient reports managing pain well, she is using nitrous  Objective: BP 107/71   Pulse 83   Temp 98.2 F (36.8 C) (Oral)   Resp 18   Ht 5\' 9"  (1.753 m)   Wt 122 kg   LMP 09/16/2021   SpO2 100%   BMI 39.72 kg/m  I/O last 3 completed shifts: In: 668.8 [I.V.:668.8] Out: -  No intake/output data recorded.  FHT:  FHR: 120-125 bpm, variability: moderate,  accelerations:  Present,  decelerations:  Absent UC:   regular, every 3 minutes SVE:   Dilation: 4.5 Effacement (%): 50 Station: 0 Exam by:: [email or web address], CNM AROM done at 06:15 - clear Pitocin @7mU   Labs: Lab Results  Component Value Date   WBC 9.7 06/23/2022   HGB 10.3 (L) 06/23/2022   HCT 31.2 (L) 06/23/2022   MCV 94.0 06/23/2022   PLT 197 06/23/2022    Assessment / Plan: Induction of labor due to term with favorable cervix,  progressing well on pitocin  Labor: Progressing on Pitocin, will continue to increase 2x2 Fetal Wellbeing:  Category I Pain Control:  Nitrous Oxide and plans for epidural I/D:  n/a Anticipated MOD:  NSVD  06/25/2022 MD 06/23/2022, 7:58 AM

## 2022-06-23 NOTE — Anesthesia Procedure Notes (Signed)
Epidural Patient location during procedure: OB Start time: 06/23/2022 8:54 AM End time: 06/23/2022 9:10 AM  Staffing Anesthesiologist: Lowella Curb, MD Performed: anesthesiologist   Preanesthetic Checklist Completed: patient identified, IV checked, site marked, risks and benefits discussed, surgical consent, monitors and equipment checked, pre-op evaluation and timeout performed  Epidural Patient position: sitting Prep: ChloraPrep Patient monitoring: heart rate, cardiac monitor, continuous pulse ox and blood pressure Approach: midline Location: L2-L3 Injection technique: LOR saline  Needle:  Needle type: Tuohy  Needle gauge: 17 G Needle length: 9 cm Needle insertion depth: 6 cm Catheter type: closed end flexible Catheter size: 20 Guage Catheter at skin depth: 10 cm Test dose: negative  Assessment Events: blood not aspirated, injection not painful, no injection resistance, no paresthesia and negative IV test  Additional Notes Epidural placed by SRNA under direct supervisionReason for block:procedure for pain

## 2022-06-23 NOTE — H&P (Addendum)
OB ADMISSION/ HISTORY & PHYSICAL:  Admission Date: 06/23/2022 12:00 AM  Admit Diagnosis: Maternal obesity  Stacie Simpson is a 26 y.o. female [phone] [redacted]w[redacted]d presenting for IOL for maternal obesity in pregnancy with a BMI of 39. Endorses active FM, denies LOF and vaginal bleeding. Hx of pre-eclampsia in 2022 and HSV. C/O SOB this preg, seen by cardiology--EKG sinus tachycardia with non-specific ST-T wave changes. 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  History of current pregnancy: [id number]   Patient entered care with [facility] at 13+1 wks.   EDC 06/23/22 by 7+0 wk U/S.   Anatomy scan:  23 wks, complete w/ posterior placenta.   Antenatal testing: None Last evaluation: 37+6 wks BPP 8/8 for maternal obesity   Significant prenatal events:  Patient Active Problem List   Diagnosis Date Noted   Body mass index (BMI) 38.0-38.9, adult 06/23/2022   Hx of pre-eclampsia in prior pregnancy, currently pregnant 06/23/2022   BMI 40.0-44.9, adult (HCC) 05/13/2021    Prenatal Labs: ABO, Rh: --/--/O POS (07/13 8182) Antibody: NEG (07/13 0051) Rubella: Immune (01/25 0000)  RPR: Nonreactive (01/25 0000)  HBsAg: Negative (01/25 0000)  HIV: Non-reactive (01/25 0000)  GTT: normal 1 hr GBS: Negative/-- (06/27 0000)  GC/CHL: neg/neg Genetics: SMA carrier Vaccines: Tdap: Y Influenza: N   OB History  Gravida Para Term Preterm AB Living  3 2 2     2   SAB IAB Ectopic Multiple Live Births        0 2    # Outcome Date GA Lbr Len/2nd Weight Sex Delivery Anes PTL Lv  3 Current           2 Term 05/14/21 [redacted]w[redacted]d / 00:28 3841 g F Vag-Spont EPI  LIV     Birth Comments: wnl  1 Term 10/22/14 [redacted]w[redacted]d 17:44 / 00:23 3830 g M Vag-Spont EPI  LIV     Birth Comments: WNL    Medical / Surgical History: Past medical history:  Past Medical History:  Diagnosis Date   Anemia    Anxiety    Depression    GERD (gastroesophageal reflux disease)    HSV-2 infection    Obesity    Pregnancy induced hypertension    TMJ  (temporomandibular joint disorder)    Vision abnormalities    Pt wears contacts    Past surgical history:  Past Surgical History:  Procedure Laterality Date   PILONIDAL CYST / SINUS EXCISION     PILONIDAL CYST EXCISION     WISDOM TOOTH EXTRACTION  2013   Family History:  Family History  Problem Relation Age of Onset   Healthy Mother    Hypertension Father    Hypertension Maternal Grandmother    Hypertension Maternal Grandfather    Diabetes Maternal Grandfather    Heart disease Maternal Grandfather    Hypertension Paternal Grandmother    Hypertension Paternal Grandfather    Cancer Paternal Grandfather        tongue   Heart disease Paternal Grandfather     Social History:  reports that she has never smoked. She has never used smokeless tobacco. She reports that she does not currently use alcohol. She reports that she does not currently use drugs after having used the following drugs: Marijuana.  Allergies: Patient has no known allergies.   Current Medications at time of admission:  Prior to Admission medications   Medication Sig Start Date End Date Taking? Authorizing Provider  aspirin EC 81 MG tablet Take 81 mg by mouth daily. Swallow whole.  [provider]  cyclobenzaprine (FLEXERIL) 10 MG tablet Take 1 tablet (10 mg total) by mouth 2 (two) times daily as needed for muscle spasms. 04/29/22   Raelyn Mora, CNM  docusate sodium (COLACE) 100 MG capsule Take 1 capsule (100 mg total) by mouth 2 (two) times daily. 06/10/22 07/10/22  Myna Hidalgo, DO  famotidine (PEPCID) 10 MG tablet Take 1 tablet (10 mg total) by mouth 2 (two) times daily. 03/09/22 04/08/22  Calvert Cantor, CNM  fexofenadine (ALLEGRA ALLERGY) 180 MG tablet Take 1 tablet (180 mg total) by mouth daily for 15 days. 10/20/21 11/04/21  Trevor Iha, FNP  lidocaine (XYLOCAINE) 5 % ointment Apply 1 Application topically as needed. 06/10/22   Myna Hidalgo, DO  loratadine (CLARITIN) 10 MG tablet Take 10  mg by mouth daily as needed for allergies.    [provider]  omeprazole (PRILOSEC) 20 MG capsule Take 1 capsule (20 mg total) by mouth daily. 03/09/22 04/08/22  Calvert Cantor, CNM  Prenatal Vit-Fe Fumarate-FA (PRENATAL MULTIVITAMIN) TABS tablet Take 1 tablet by mouth daily at 12 noon.    [provider]  valACYclovir (VALTREX) 500 MG tablet Take 500 mg by mouth as needed.    [provider]  fluticasone (FLONASE) 50 MCG/ACT nasal spray Place 1-2 sprays into both nostrils daily as needed for allergies or rhinitis.  04/10/21  [provider]    Review of Systems: Constitutional: Negative   HENT: Negative   Eyes: Negative   Respiratory: Negative   Cardiovascular: Negative   Gastrointestinal: Negative  Genitourinary: neg for bloody show, neg for LOF   Musculoskeletal: Negative   Skin: Negative   Neurological: Negative   Endo/Heme/Allergies: Negative   Psychiatric/Behavioral: Negative    Physical Exam: VS: Blood pressure 108/71, pulse 88, temperature 98.1 F (36.7 C), temperature source Oral, resp. rate 16, height 5\' 9"  (1.753 m), weight 122 kg, last menstrual period 09/16/2021, SpO2 100 %, unknown if currently breastfeeding. AAO x3, no signs of distress Cardiovascular: RRR Respiratory: Unlabored GU/GI: Abdomen gravid, non-tender, non-distended, active FM, vertex Extremities: no edema, negative for pain, tenderness, and cords  Cervical exam:Dilation: 4.5 Effacement (%): 50 Station: 0 Exam by:: [email or web address], CNM FHR: baseline rate 130 / variability moderate / accelerations present / absent decelerations TOCO: 3-4   Prenatal Transfer Tool  Maternal Diabetes: No Genetic Screening: Abnormal:  Results: Other:SMA carrier, declined partner testing Maternal Ultrasounds/Referrals: Normal Fetal Ultrasounds or other Referrals:  None Maternal Substance Abuse:  No Significant Maternal Medications:  None Significant Maternal Lab Results: Group B  Strep negative    Assessment: 26 y.o. 30 [redacted]w[redacted]d  Latent stage of labor FHR category 1 GBS neg HSV pos on suppression Pain management plan: nitrous and epidural   Plan:  Admit to L&D Routine admission orders Epidural PRN Negative prodromal S/S, neg exam Pitocin induction AROM Dr [redacted]w[redacted]d notified of admission and plan of care  Mora Appl DNP, CNM 06/23/2022 6:38 AM

## 2022-06-23 NOTE — Progress Notes (Signed)
MD LABOR PROGRESS NOTE  Stacie Simpson is a 26 y.o. G3P2002 at [redacted]w[redacted]d admitted for induction of labor due to Elective at term.  Subjective: Patient comfortable now with epidural  Objective: BP (!) 102/49   Pulse 82   Temp 97.9 F (36.6 C)   Resp 18   Ht 5\' 9"  (1.753 m)   Wt 122 kg   LMP 09/16/2021   SpO2 100%   BMI 39.72 kg/m  I/O last 3 completed shifts: In: 668.8 [I.V.:668.8] Out: -  No intake/output data recorded.  FHT:  FHR: 130 bpm, variability: moderate,  accelerations:  Present,  decelerations:  Present intermittent early variable decels with contractions nadir 110's immediate return to baseline UC:   regular, every 3-4 minutes SVE:   Dilation: 5 Effacement (%): 70 Station: -3 Exam by:: Dr [email or web address] IUPC placed  Labs: Lab Results  Component Value Date   WBC 9.7 06/23/2022   HGB 10.3 (L) 06/23/2022   HCT 31.2 (L) 06/23/2022   MCV 94.0 06/23/2022   PLT 197 06/23/2022    Assessment / Plan: Induction of labor due to BMI & elective,  stalled at 5cm and fetal station is high.  Position occiput posterior. IUPC will titrate up on pitocin to adequate MVUs  Labor:  prolonged active Fetal Wellbeing:  Category I Pain Control:  Epidural I/D:  n/a Anticipated MOD:  NSVD  06/25/2022 MD 06/23/2022, 10:59 AM

## 2022-06-23 NOTE — Anesthesia Preprocedure Evaluation (Signed)
Anesthesia Evaluation  Patient identified by MRN, date of birth, ID band Patient awake    Reviewed: Allergy & Precautions, Patient's Chart, lab work & pertinent test results  History of Anesthesia Complications Negative for: history of anesthetic complications  Airway Mallampati: II  TM Distance: >3 FB Neck ROM: Full    Dental no notable dental hx.    Pulmonary neg pulmonary ROS,    Pulmonary exam normal        Cardiovascular hypertension, Pt. on medications Normal cardiovascular exam     Neuro/Psych Anxiety Depression negative neurological ROS     GI/Hepatic Neg liver ROS, GERD  ,  Endo/Other    Renal/GU negative Renal ROS  negative genitourinary   Musculoskeletal negative musculoskeletal ROS (+)   Abdominal (+) + obese,   Peds  Hematology  (+) Blood dyscrasia, anemia , Hgb 11.7   Anesthesia Other Findings Day of surgery medications reviewed with patient.  Reproductive/Obstetrics (+) Pregnancy (suspected LGA fetus)                             Anesthesia Physical  Anesthesia Plan  ASA: III  Anesthesia Plan: Epidural   Post-op Pain Management:    Induction:   PONV Risk Score and Plan: Treatment may vary due to age or medical condition  Airway Management Planned: Natural Airway  Additional Equipment:   Intra-op Plan:   Post-operative Plan:   Informed Consent: I have reviewed the patients History and Physical, chart, labs and discussed the procedure including the risks, benefits and alternatives for the proposed anesthesia with the patient or authorized representative who has indicated his/her understanding and acceptance.       Plan Discussed with:   Anesthesia Plan Comments:         Anesthesia Quick Evaluation

## 2022-06-23 NOTE — Progress Notes (Signed)
Subjective:    Requesting nitrous gas and plans to have an epidural later. Agrees to VE and amniotomy.   Objective:    VS: BP 108/71   Pulse 88   Temp 98.1 F (36.7 C) (Oral)   Resp 16   Ht 5\' 9"  (1.753 m)   Wt 122 kg   LMP 09/16/2021   SpO2 100%   BMI 39.72 kg/m  FHR : baseline 130 / variability moderate / accelerations present / absent decelerations Toco: contractions every 2-4 minutes  Membranes: AROM, clear Dilation: 4.5 Effacement (%): 50 Station: 0 Presentation: Vertex Exam by:: [email or web address], CNM Pitocin 6 mU/min  Assessment/Plan:   26 y.o. G3P2002 [redacted]w[redacted]d IOL for maternal obesity  Labor: Progressing normally Fetal Wellbeing:  Category I Pain Control:  Nitrous Oxide I/D:   GBS neg Anticipated MOD:  NSVD  [redacted]w[redacted]d DNP, CNM 06/23/2022 27:36 AM

## 2022-06-24 LAB — CBC
HCT: 28.6 % — ABNORMAL LOW (ref 36.0–46.0)
Hemoglobin: 9.5 g/dL — ABNORMAL LOW (ref 12.0–15.0)
MCH: 30.9 pg (ref 26.0–34.0)
MCHC: 33.2 g/dL (ref 30.0–36.0)
MCV: 93.2 fL (ref 80.0–100.0)
Platelets: 169 10*3/uL (ref 150–400)
RBC: 3.07 MIL/uL — ABNORMAL LOW (ref 3.87–5.11)
RDW: 12.8 % (ref 11.5–15.5)
WBC: 9.8 10*3/uL (ref 4.0–10.5)
nRBC: 0 % (ref 0.0–0.2)

## 2022-06-24 MED ORDER — IBUPROFEN 600 MG PO TABS: 600.0000 mg | ORAL_TABLET | Freq: Four times a day (QID) | ORAL | 0 refills | Status: AC

## 2022-06-24 MED ORDER — ACETAMINOPHEN 325 MG PO TABS: 650.0000 mg | ORAL_TABLET | ORAL | Status: AC | PRN

## 2022-06-24 NOTE — Social Work (Signed)
Stacie Simpson was referred for history of depression/anxiety.  * Referral screened out by Clinical Social Worker because none of the following criteria appear to apply:  ~ History of anxiety/depression during this pregnancy, or of post-partum depression following prior delivery.   ~ Diagnosis of anxiety and/or depression within last 3 years. Stacie Simpson was diagnosed with depression and anxiety in 2013. There were no concerns noted in OB record. Stacie Simpson completed Edinburgh score 0.  OR * Stacie Simpson's symptoms currently being treated with medication and/or therapy.   Please contact the Clinical Social Worker if needs arise, by [facility] request, or if Stacie Simpson scores greater than 9/yes to question 10 on Edinburgh Postpartum Depression Screen.   Vivi Barrack, MSW, LCSW [facility]  Clinical Social Worker  [phone] 06/24/2022  8:54 AM

## 2022-06-24 NOTE — Anesthesia Postprocedure Evaluation (Signed)
Anesthesia Post Note  Patient: Stacie Simpson  Procedure(s) Performed: AN AD HOC LABOR EPIDURAL     Patient location during evaluation: Mother Baby Anesthesia Type: Epidural Level of consciousness: awake and alert Pain management: pain level controlled Vital Signs Assessment: post-procedure vital signs reviewed and stable Respiratory status: spontaneous breathing, nonlabored ventilation and respiratory function stable Cardiovascular status: stable Postop Assessment: no headache, no backache, epidural receding, no apparent nausea or vomiting, patient able to bend at knees, adequate PO intake and able to ambulate Anesthetic complications: no   No notable events documented.  Last Vitals:  Vitals:   06/23/22 1845 06/24/22 0402  BP: 114/69 119/82  Pulse: 81 74  Resp: 18 18  Temp: 37.2 C 37 C  SpO2:  100%    Last Pain:  Vitals:   06/24/22 0652  TempSrc:   PainSc: Asleep   Pain Goal:                   Laban Emperor

## 2022-06-24 NOTE — Discharge Summary (Signed)
Postpartum Discharge Summary  Date of Service updated 06/24/22    Patient Name: Stacie Simpson DOB: Jun 23, 1996 MRN: [id number]  Date of admission: 06/23/2022 Delivery date:06/23/2022  Delivering provider: Sanjuana Kava  Date of discharge: 06/24/2022  Admitting diagnosis: Body mass index (BMI) 38.0-38.9, adult [Z68.38] Intrauterine pregnancy: [redacted]w[redacted]d    Secondary diagnosis:  Principal Problem:   Body mass index (BMI) 38.0-38.9, adult Active Problems:   Hx of pre-eclampsia in prior pregnancy, currently pregnant  Additional problems: none    Discharge diagnosis: Term Pregnancy Delivered                                              Post partum procedures: none Augmentation: AROM and Pitocin Complications: None  Hospital course: Induction of Labor With Vaginal Delivery   26y.o. yo G3P3003 at 464w0das admitted to the hospital 06/23/2022 for induction of labor for maternal obesity.  Indication for induction:  maternal obesity .  Patient had an uncomplicated labor course as follows: Membrane Rupture Time/Date: 6:15 AM ,06/23/2022   Delivery Method:Vaginal, Spontaneous  Episiotomy: None  Lacerations:  None  Details of delivery can be found in separate delivery note.  Patient had a routine postpartum course. Patient is discharged home 06/24/22.  Newborn Data: Birth date:06/23/2022  Birth time:12:12 PM  Gender:Female  Living status:Living  Apgars:9 ,9  Weight:3490 g   Magnesium Sulfate received: No BMZ received: No Rhophylac:N/A MMR:N/A T-DaP:Given prenatally Flu: No Transfusion:No  Physical exam  Vitals:   06/23/22 1405 06/23/22 1505 06/23/22 1845 06/24/22 0402  BP: 125/77 119/74 114/69 119/82  Pulse: 70 69 81 74  Resp: _0 Temp: 98.2 F (36.8 C) 98.3 F (36.8 C) 98.9 F (37.2 C) 98.6 F (37 C)  TempSrc: Oral Oral Oral Axillary  SpO2:    100%  Weight:      Height:       General: alert Lochia: appropriate Uterine Fundus: firm Incision: N/A DVT  Evaluation: No evidence of DVT seen on physical exam. No cords or calf tenderness. Calf/Ankle edema is present Labs: Lab Results  Component Value Date   WBC 9.8 06/24/2022   HGB 9.5 (L) 06/24/2022   HCT 28.6 (L) 06/24/2022   MCV 93.2 06/24/2022   PLT 169 06/24/2022      Latest Ref Rng & Units 03/09/2022   12:03 PM  CMP  Glucose 70 - 99 mg/dL 89   BUN 6 - 20 mg/dL 6   Creatinine 0.44 - 1.00 mg/dL 0.63   Sodium 135 - 145 mmol/L 135   Potassium 3.5 - 5.1 mmol/L 3.3   Chloride 98 - 111 mmol/L 105   CO2 22 - 32 mmol/L 22   Calcium 8.9 - 10.3 mg/dL 9.0   Total Protein 6.5 - 8.1 g/dL 6.9   Total Bilirubin 0.3 - 1.2 mg/dL 0.5   Alkaline Phos 38 - 126 U/L 57   AST 15 - 41 U/L 14   ALT 0 - 44 U/L 11    Edinburgh Score:    06/23/2022    2:05 PM  Edinburgh Postnatal Depression Scale Screening Tool  I have been able to laugh and see the funny side of things. 0  I have looked forward with enjoyment to things. 0  I have blamed myself unnecessarily when things went wrong. 0  I have been anxious or  worried for no good reason. 0  I have felt scared or panicky for no good reason. 0  Things have been getting on top of me. 0  I have been so unhappy that I have had difficulty sleeping. 0  I have felt sad or miserable. 0  I have been so unhappy that I have been crying. 0  The thought of harming myself has occurred to me. 0  Edinburgh Postnatal Depression Scale Total 0      After visit meds:  Allergies as of 06/24/2022   No Known Allergies      Medication List     STOP taking these medications    docusate sodium 100 MG capsule Commonly known as: Colace   hydrocortisone 2.5 % rectal cream Commonly known as: ANUSOL-HC   lidocaine 5 % ointment Commonly known as: XYLOCAINE   valACYclovir 500 MG tablet Commonly known as: VALTREX       TAKE these medications    acetaminophen 325 MG tablet Commonly known as: Tylenol Take 2 tablets (650 mg total) by mouth every 4 (four)  hours as needed (for pain scale < 4). What changed:  medication strength how much to take when to take this reasons to take this   ibuprofen 600 MG tablet Commonly known as: ADVIL Take 1 tablet (600 mg total) by mouth every 6 (six) hours.   loratadine 10 MG tablet Commonly known as: CLARITIN Take 10 mg by mouth daily as needed for allergies.   prenatal multivitamin Tabs tablet Take 1 tablet by mouth daily at 12 noon.         Discharge home in stable condition Infant Feeding: Bottle Infant Disposition:home with mother Discharge instruction: per After Visit Summary and Postpartum booklet. Activity: Advance as tolerated. Pelvic rest for 6 weeks.  Diet: routine diet Anticipated Birth Control: IUD Postpartum Appointment:6 weeks Additional Postpartum F/U: BP check 1 week Future Appointments:No future appointments. Follow up Visit:  [facility]. Go in 6 week(s).   Specialty: Obstetrics and Gynecology Contact information: [address] [place] [address] [phone]                    06/24/2022 Arrie Eastern, CNM

## 2022-07-02 ENCOUNTER — Telehealth (HOSPITAL_COMMUNITY): Payer: Self-pay

## 2022-07-02 NOTE — Telephone Encounter (Signed)
Patient did not answer phone call. Voicemail left for patient.   Marcelino Duster [facility] 07/02/2022,1125

## 2022-08-16 ENCOUNTER — Other Ambulatory Visit: Payer: Self-pay | Admitting: Obstetrics and Gynecology

## 2022-08-17 ENCOUNTER — Other Ambulatory Visit: Payer: Self-pay | Admitting: Obstetrics and Gynecology

## 2022-08-17 ENCOUNTER — Other Ambulatory Visit (HOSPITAL_COMMUNITY): Payer: Self-pay | Admitting: Obstetrics and Gynecology

## 2022-08-18 ENCOUNTER — Encounter (HOSPITAL_COMMUNITY): Payer: Self-pay | Admitting: Obstetrics and Gynecology

## 2022-08-18 ENCOUNTER — Other Ambulatory Visit: Payer: Self-pay

## 2022-08-18 DIAGNOSIS — A6000 Herpesviral infection of urogenital system, unspecified: Secondary | ICD-10-CM | POA: Diagnosis not present

## 2022-08-18 DIAGNOSIS — Z148 Genetic carrier of other disease: Secondary | ICD-10-CM

## 2022-08-18 DIAGNOSIS — Z862 Personal history of diseases of the blood and blood-forming organs and certain disorders involving the immune mechanism: Secondary | ICD-10-CM

## 2022-08-18 NOTE — H&P (Addendum)
Stacie Simpson is a 26 y.o. female, G3P3003, presenting for D&E on 08/19/22 due to retained products from SVB on 06/23/22.    Patient had induction of labor on 06/23/22 for BMI, with uncomplicated labor course and SVB, no lacerations noted, delivery by Dr. Mora Appl. Placenta was noted to be intact at delivery, with trailing membranes.  Mild uterine atony was noted, resolved with IV pitocin and massage. Rest of postpartum course was uncomplicated, with day 1 Hgb 9.5 (down from 10.3 at admission).  She was seen for her routine PP exam buy Dr. Sallye Ober on 8/18, with small amount of bleeding still noted, but patient felt it was diminishing, had neg physical exam.  Seen 8/29 for episode of heavy bleeding, with US showing possible small RPOC, 1.3 x 1.8 x 1.2.  Vital signs were stable, patient was afebrile.  I consulted with Dr. Su Hilt regarding management.  Options for D&E or medical management with Methergine were discussed.  Patient elected Methergine, with Rx for 48 hour course and 7 days of Cephalexin.  CBC WNL, with Hgb 11.3.  Seen for recheck on 9/1--still having small amount bleeding, denied pain/fever. [place] still showed small area of retained POC, 0.9 x 0.7 x 1.1.  D&E recommended, patient agreeable, wanted to schedule this week based on work schedule.  Patient is breastfeeding, baby doing well.  Patient denies any PP depression.   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  Past Medical History:  Diagnosis Date   Anemia    Anxiety    Depression    GERD (gastroesophageal reflux disease)    HSV-2 infection    Obesity    Pregnancy induced hypertension    TMJ (temporomandibular joint disorder)    Vision abnormalities    Pt wears contacts   Past Surgical History:  Procedure Laterality Date   PILONIDAL CYST / SINUS EXCISION     PILONIDAL CYST EXCISION     WISDOM TOOTH EXTRACTION  2013   Family History: family history includes Cancer in her paternal grandfather; Diabetes in her maternal grandfather; Healthy in her mother; Heart disease in her maternal grandfather and paternal grandfather; Hypertension in her father, maternal grandfather, maternal grandmother, paternal grandfather, and paternal grandmother.  Social History:  reports that she has never smoked. She has never used smokeless tobacco. She reports that she does not currently use alcohol. She reports that she does not currently use drugs after having used the following drugs: Marijuana.  Patient is Black, employed as Print production planner, married to [employer], who is involved and supportive.  TDAP 05/05/22 Flu NA  ROS:  Small  amount vaginal bleeding, denies cramping, no fever or other sx.  No Known Allergies      Chest clear Heart RRR without murmur Abd NT, no rebound or guarding Pelvic: Small amount dark blood in vault at last exam, uterus small, NT. Adnexa WNL, NT Ext: WNL  Recent labs: ABO, Rh: --/--/O POS (07/13 0051) Antibody: NEG (07/13 0051) Rubella:  Immune (01/25 0000) RPR: NON REACTIVE (07/13 0051)  HBsAg: Negative (01/25 0000)  HIV: Non-reactive (01/25 0000)  GBS: Negative/-- (06/27 0000) Sickle cell/Hgb electrophoresis:  AA Pap:  07/29/22 WNL GC:  Neg 07/29/22 Chlamydia:  Neg 07/29/22 Other:   Hgb 11.3 on 08/09/22.     Assessment/Plan: Retained POC s/p SVB 06/23/22  Plan: Admit for outpatient D&E on 08/19/22 per Dr. Su Hilt. Routine pre-op orders US guidance prn. F/u per Dr. Su Hilt.  Nigel Bridgeman, CNM, [place] 08/18/2022, 6:37 AM  Risks benefits alternatives reviewed with the patient including but not limited to bleeding infection and injury. Questions answered and consent signed and witnessed.

## 2022-08-18 NOTE — Progress Notes (Signed)
Spoke with pt for pre-op call. Pt denies cardiac history or Diabetes. She states the only time she had problems with her blood pressure was during her 1st pregnancy in 2015.   Shower instructions given to pt and she voiced understanding.

## 2022-08-19 ENCOUNTER — Ambulatory Visit (HOSPITAL_COMMUNITY)
Admission: RE | Admit: 2022-08-19 | Discharge: 2022-08-19 | Disposition: A | Discharge status: Home / Self Care | Payer: Medicaid Other | Source: Ambulatory Visit | Attending: Obstetrics and Gynecology | Admitting: Obstetrics and Gynecology

## 2022-08-19 ENCOUNTER — Ambulatory Visit (HOSPITAL_BASED_OUTPATIENT_CLINIC_OR_DEPARTMENT_OTHER): Payer: Medicaid Other | Admitting: Anesthesiology

## 2022-08-19 ENCOUNTER — Ambulatory Visit (HOSPITAL_COMMUNITY): Payer: Medicaid Other | Admitting: Anesthesiology

## 2022-08-19 ENCOUNTER — Encounter (HOSPITAL_COMMUNITY): Payer: Self-pay | Admitting: Obstetrics and Gynecology

## 2022-08-19 ENCOUNTER — Other Ambulatory Visit: Payer: Self-pay

## 2022-08-19 ENCOUNTER — Encounter (HOSPITAL_COMMUNITY)
Admission: RE | Disposition: A | Discharge status: Home / Self Care | Payer: Self-pay | Source: Ambulatory Visit | Attending: Obstetrics and Gynecology

## 2022-08-19 DIAGNOSIS — Z862 Personal history of diseases of the blood and blood-forming organs and certain disorders involving the immune mechanism: Secondary | ICD-10-CM

## 2022-08-19 DIAGNOSIS — Z01818 Encounter for other preprocedural examination: Secondary | ICD-10-CM

## 2022-08-19 DIAGNOSIS — Z148 Genetic carrier of other disease: Secondary | ICD-10-CM

## 2022-08-19 DIAGNOSIS — A6000 Herpesviral infection of urogenital system, unspecified: Secondary | ICD-10-CM | POA: Diagnosis not present

## 2022-08-19 HISTORY — PX: OPERATIVE ULTRASOUND: SHX5996

## 2022-08-19 HISTORY — PX: DILATION AND EVACUATION: SHX1459

## 2022-08-19 HISTORY — DX: COVID-19: U07.1

## 2022-08-19 LAB — CBC
HCT: 36.4 % (ref 36.0–46.0)
Hemoglobin: 11.6 g/dL — ABNORMAL LOW (ref 12.0–15.0)
MCH: 28.6 pg (ref 26.0–34.0)
MCHC: 31.9 g/dL (ref 30.0–36.0)
MCV: 89.9 fL (ref 80.0–100.0)
Platelets: 279 10*3/uL (ref 150–400)
RBC: 4.05 MIL/uL (ref 3.87–5.11)
RDW: 13.1 % (ref 11.5–15.5)
WBC: 6.3 10*3/uL (ref 4.0–10.5)
nRBC: 0 % (ref 0.0–0.2)

## 2022-08-19 LAB — TYPE AND SCREEN
ABO/RH(D): O POS
Antibody Screen: NEGATIVE

## 2022-08-19 LAB — POCT PREGNANCY, URINE: Preg Test, Ur: NEGATIVE

## 2022-08-19 SURGERY — DILATION AND EVACUATION, UTERUS
Anesthesia: General | Site: Vagina

## 2022-08-19 MED ORDER — OXYCODONE HCL 5 MG PO TABS: 5.0000 mg | ORAL_TABLET | ORAL | 0 refills | Status: DC | PRN

## 2022-08-19 MED ORDER — ONDANSETRON HCL 4 MG/2ML IJ SOLN: 4.0000 mg | Freq: Once | INTRAMUSCULAR | Status: DC | PRN

## 2022-08-19 MED ORDER — LIDOCAINE HCL 2 % IJ SOLN
INTRAMUSCULAR | Status: DC | PRN
  Administered 2022-08-19: 10 mL

## 2022-08-19 MED ORDER — FENTANYL CITRATE (PF) 250 MCG/5ML IJ SOLN
INTRAMUSCULAR | Status: AC
  Filled 2022-08-19: qty 5

## 2022-08-19 MED ORDER — 0.9 % SODIUM CHLORIDE (POUR BTL) OPTIME
TOPICAL | Status: DC | PRN
  Administered 2022-08-19: 1000 mL

## 2022-08-19 MED ORDER — MIDAZOLAM HCL 2 MG/2ML IJ SOLN
INTRAMUSCULAR | Status: AC
  Filled 2022-08-19: qty 2

## 2022-08-19 MED ORDER — ONDANSETRON HCL 4 MG/2ML IJ SOLN
INTRAMUSCULAR | Status: DC | PRN
  Administered 2022-08-19: 4 mg via INTRAVENOUS

## 2022-08-19 MED ORDER — LACTATED RINGERS IV SOLN: INTRAVENOUS | Status: DC

## 2022-08-19 MED ORDER — POVIDONE-IODINE 10 % EX SWAB
2.0000 | Freq: Once | CUTANEOUS | Status: AC
  Administered 2022-08-19: 2 via TOPICAL

## 2022-08-19 MED ORDER — OXYCODONE HCL 5 MG/5ML PO SOLN: 5.0000 mg | Freq: Once | ORAL | Status: DC | PRN

## 2022-08-19 MED ORDER — CHLORHEXIDINE GLUCONATE 0.12 % MT SOLN
15.0000 mL | Freq: Once | OROMUCOSAL | Status: AC
Start: 2022-08-19 — End: 2022-08-19
  Administered 2022-08-19: 15 mL via OROMUCOSAL
  Filled 2022-08-19: qty 15

## 2022-08-19 MED ORDER — PROPOFOL 10 MG/ML IV BOLUS
INTRAVENOUS | Status: AC
  Filled 2022-08-19: qty 20

## 2022-08-19 MED ORDER — LIDOCAINE HCL 2 % IJ SOLN
INTRAMUSCULAR | Status: AC
  Filled 2022-08-19: qty 20

## 2022-08-19 MED ORDER — FENTANYL CITRATE (PF) 100 MCG/2ML IJ SOLN
INTRAMUSCULAR | Status: DC | PRN
  Administered 2022-08-19 (×3): 25 ug via INTRAVENOUS

## 2022-08-19 MED ORDER — ONDANSETRON HCL 4 MG/2ML IJ SOLN
INTRAMUSCULAR | Status: AC
  Filled 2022-08-19: qty 2

## 2022-08-19 MED ORDER — OXYCODONE HCL 5 MG PO TABS: 5.0000 mg | ORAL_TABLET | Freq: Once | ORAL | Status: DC | PRN

## 2022-08-19 MED ORDER — CEFAZOLIN SODIUM-DEXTROSE 2-3 GM-%(50ML) IV SOLR
INTRAVENOUS | Status: DC | PRN
  Administered 2022-08-19: 2 g via INTRAVENOUS

## 2022-08-19 MED ORDER — CARBOPROST TROMETHAMINE 250 MCG/ML IM SOLN
INTRAMUSCULAR | Status: AC
  Filled 2022-08-19: qty 1

## 2022-08-19 MED ORDER — ORAL CARE MOUTH RINSE: 15.0000 mL | Freq: Once | OROMUCOSAL | Status: AC

## 2022-08-19 MED ORDER — AMISULPRIDE (ANTIEMETIC) 5 MG/2ML IV SOLN: 10.0000 mg | Freq: Once | INTRAVENOUS | Status: DC | PRN

## 2022-08-19 MED ORDER — DEXAMETHASONE SODIUM PHOSPHATE 10 MG/ML IJ SOLN
INTRAMUSCULAR | Status: DC | PRN
  Administered 2022-08-19: 10 mg via INTRAVENOUS

## 2022-08-19 MED ORDER — PROPOFOL 10 MG/ML IV BOLUS
INTRAVENOUS | Status: DC | PRN
  Administered 2022-08-19: 50 mg via INTRAVENOUS
  Administered 2022-08-19: 200 mg via INTRAVENOUS
  Administered 2022-08-19: 50 mg via INTRAVENOUS

## 2022-08-19 MED ORDER — LIDOCAINE 2% (20 MG/ML) 5 ML SYRINGE
INTRAMUSCULAR | Status: DC | PRN
  Administered 2022-08-19: 100 mg via INTRAVENOUS

## 2022-08-19 MED ORDER — LIDOCAINE 2% (20 MG/ML) 5 ML SYRINGE
INTRAMUSCULAR | Status: AC
  Filled 2022-08-19: qty 5

## 2022-08-19 MED ORDER — DEXAMETHASONE SODIUM PHOSPHATE 10 MG/ML IJ SOLN
INTRAMUSCULAR | Status: AC
  Filled 2022-08-19: qty 1

## 2022-08-19 MED ORDER — MIDAZOLAM HCL 5 MG/5ML IJ SOLN
INTRAMUSCULAR | Status: DC | PRN
  Administered 2022-08-19: 2 mg via INTRAVENOUS

## 2022-08-19 MED ORDER — HYDROMORPHONE HCL 1 MG/ML IJ SOLN: 0.2500 mg | INTRAMUSCULAR | Status: DC | PRN

## 2022-08-19 SURGICAL SUPPLY — 20 items
CATH ROBINSON RED A/P 16FR (CATHETERS) ×2 IMPLANT
FILTER UTR ASPR ASSEMBLY (MISCELLANEOUS) ×2 IMPLANT
GLOVE BIO SURGEON STRL SZ7.5 (GLOVE) ×2 IMPLANT
GLOVE BIOGEL PI IND STRL 7.0 (GLOVE) ×2 IMPLANT
GLOVE BIOGEL PI IND STRL 7.5 (GLOVE) ×2 IMPLANT
GOWN STRL REUS W/ TWL LRG LVL3 (GOWN DISPOSABLE) ×4 IMPLANT
GOWN STRL REUS W/TWL LRG LVL3 (GOWN DISPOSABLE) ×4
HOSE CONNECTING 18IN BERKELEY (TUBING) ×2 IMPLANT
KIT BERKELEY 1ST TRIMESTER 3/8 (MISCELLANEOUS) ×4 IMPLANT
NS IRRIG 1000ML POUR BTL (IV SOLUTION) ×2 IMPLANT
PACK VAGINAL MINOR WOMEN LF (CUSTOM PROCEDURE TRAY) ×2 IMPLANT
PAD OB MATERNITY 4.3X12.25 (PERSONAL CARE ITEMS) ×2 IMPLANT
SET BERKELEY SUCTION TUBING (SUCTIONS) ×2 IMPLANT
SPIKE FLUID TRANSFER (MISCELLANEOUS) ×2 IMPLANT
TOWEL GREEN STERILE FF (TOWEL DISPOSABLE) ×4 IMPLANT
UNDERPAD 30X36 HEAVY ABSORB (UNDERPADS AND DIAPERS) ×2 IMPLANT
VACURETTE 10 RIGID CVD (CANNULA) IMPLANT
VACURETTE 7MM CVD STRL WRAP (CANNULA) IMPLANT
VACURETTE 8 RIGID CVD (CANNULA) IMPLANT
VACURETTE 9 RIGID CVD (CANNULA) IMPLANT

## 2022-08-19 NOTE — Anesthesia Preprocedure Evaluation (Addendum)
Anesthesia Evaluation  Patient identified by MRN, date of birth, ID band Patient awake    Reviewed: Allergy & Precautions, NPO status , Patient's Chart, lab work & pertinent test results  Airway Mallampati: III  TM Distance: >3 FB Neck ROM: Full    Dental  (+) Dental Advisory Given, Teeth Intact   Pulmonary neg pulmonary ROS,    Pulmonary exam normal breath sounds clear to auscultation       Cardiovascular negative cardio ROS Normal cardiovascular exam Rhythm:Regular Rate:Normal     Neuro/Psych PSYCHIATRIC DISORDERS Anxiety Depression negative neurological ROS     GI/Hepatic Neg liver ROS, GERD  Controlled,  Endo/Other  BMI 37  Renal/GU negative Renal ROS  negative genitourinary   Musculoskeletal negative musculoskeletal ROS (+)   Abdominal (+) + obese,   Peds  Hematology  (+) Blood dyscrasia, anemia , Hb 11.6   Anesthesia Other Findings   Reproductive/Obstetrics retained products from SVB on 06/23/22                            Anesthesia Physical Anesthesia Plan  ASA: 2  Anesthesia Plan: General   Post-op Pain Management:    Induction: Intravenous  PONV Risk Score and Plan: 3 and Ondansetron, Dexamethasone, Midazolam and Treatment may vary due to age or medical condition  Airway Management Planned: LMA  Additional Equipment: None  Intra-op Plan:   Post-operative Plan: Extubation in OR  Informed Consent: I have reviewed the patients History and Physical, chart, labs and discussed the procedure including the risks, benefits and alternatives for the proposed anesthesia with the patient or authorized representative who has indicated his/her understanding and acceptance.     Dental advisory given  Plan Discussed with: CRNA  Anesthesia Plan Comments:        Anesthesia Quick Evaluation

## 2022-08-19 NOTE — Transfer of Care (Signed)
Immediate Anesthesia Transfer of Care Note  Patient: Stacie Simpson  Procedure(s) Performed: DILATATION AND EVACUATION ULTRA SOUND GUIDED (Vagina ) OPERATIVE ULTRASOUND (Abdomen)  Patient Location: PACU  Anesthesia Type:General  Level of Consciousness: drowsy  Airway & Oxygen Therapy: Patient Spontanous Breathing  Post-op Assessment: Report given to RN and Post -op Vital signs reviewed and stable  Post vital signs: Reviewed and stable  Last Vitals:  Vitals Value Taken Time  BP 121/82 08/19/22 1445  Temp 36.3 C 08/19/22 1437  Pulse 65 08/19/22 1447  Resp 18 08/19/22 1447  SpO2 98 % 08/19/22 1447  Vitals shown include unvalidated device data.  Last Pain:  Vitals:   08/19/22 1437  TempSrc:   PainSc: 0-No pain         Complications: No notable events documented.

## 2022-08-19 NOTE — Anesthesia Postprocedure Evaluation (Signed)
Anesthesia Post Note  Patient: EDYE HAINLINE  Procedure(s) Performed: DILATATION AND EVACUATION ULTRA SOUND GUIDED (Vagina ) OPERATIVE ULTRASOUND (Abdomen)     Patient location during evaluation: PACU Anesthesia Type: General Level of consciousness: awake and alert, oriented and patient cooperative Pain management: pain level controlled Vital Signs Assessment: post-procedure vital signs reviewed and stable Respiratory status: spontaneous breathing, nonlabored ventilation and respiratory function stable Cardiovascular status: blood pressure returned to baseline and stable Postop Assessment: no apparent nausea or vomiting Anesthetic complications: no   No notable events documented.  Last Vitals:  Vitals:   08/19/22 1445 08/19/22 1500  BP: 121/82   Pulse: 71 67  Resp: 12 13  Temp:    SpO2: 97% 98%    Last Pain:  Vitals:   08/19/22 1500  TempSrc:   PainSc: 0-No pain                 Lannie Fields

## 2022-08-19 NOTE — Op Note (Signed)
Preop Diagnosis: RETAINED PRODUCTS OF CONCEPTION   Postop Diagnosis: RETAINED PRODUCTS OF CONCEPTION   Procedure: DILATATION AND EVACUATION OPERATIVE ULTRASOUND   Anesthesia: Choice   Anesthesiologist: Dr. Elgie Congo   Attending: Everett Graff, MD   Assistant: N/a  Findings: POCs  Pathology: POCs  Fluids: 1000cc  UOP: 175cc  EBL: 74BS  Complications: None  Procedure: The patient was taken to the operating room after the risks benefits and alternatives were discussed with the patient, the patient verbalized understanding and consent signed and witnessed.  The patient was placed under MAC anesthesia, prepped and draped in the normal sterile fashion and a time out was performed.  A bivalve speculum was placed in the patient's vagina and the anterior lip of the cervix grasped with a single-tooth tenaculum. A paracervical block was administered using a total of 10 cc of 2% lidocaine.  The uterus was sounded to 8 cm and a size 8 suction curette was used. Suction curettage was performed until minimal tissue returned. Sharp curettage was performed until a gritty texture was noted. Suction curettage was performed once again to remove any remaining debris. All instruments were removed. The count was correct. The patient was transferred to the recovery room in good condition.

## 2022-08-20 ENCOUNTER — Encounter (HOSPITAL_COMMUNITY): Payer: Self-pay | Admitting: Obstetrics and Gynecology

## 2022-08-22 LAB — SURGICAL PATHOLOGY

## 2023-03-13 ENCOUNTER — Ambulatory Visit
Admission: EM | Admit: 2023-03-13 | Discharge: 2023-03-13 | Disposition: A | Discharge status: Home / Self Care | Payer: Medicaid Other | Attending: Urgent Care | Admitting: Urgent Care

## 2023-03-13 DIAGNOSIS — H6123 Impacted cerumen, bilateral: Secondary | ICD-10-CM

## 2023-03-13 DIAGNOSIS — H60543 Acute eczematoid otitis externa, bilateral: Secondary | ICD-10-CM | POA: Diagnosis not present

## 2023-03-13 MED ORDER — CARBAMIDE PEROXIDE 6.5 % OT SOLN
5.0000 [drp] | Freq: Once | OTIC | Status: AC
  Administered 2023-03-13: 5 [drp] via OTIC

## 2023-03-13 MED ORDER — FLUOCINOLONE ACETONIDE 0.01 % OT OIL
5.0000 [drp] | TOPICAL_OIL | Freq: Two times a day (BID) | OTIC | 0 refills | Status: DC
Start: 2023-03-13 — End: 2023-10-04

## 2023-03-13 NOTE — Discharge Instructions (Addendum)
Please purchase the NeilMed clear canal kit. Use the Debrox given today, and apply 10 drops in the right ear canal twice daily for the next 4 days. On the fifth day, try to flush out your right ear canal with the saline irrigation kit.  Use the fluocinolone otic oil as prescribed, as needed for dry skin or otic irritation.

## 2023-03-13 NOTE — ED Provider Notes (Signed)
[facility]    CSN: [id number] Arrival date & time: 03/13/23  1422      History   Chief Complaint Chief Complaint  Patient presents with   Ear Fullness    HPI Stacie Simpson is a 27 y.o. female.   Pleasant 27 year old female presents today due to concerns of fullness in her ears.  States she is prone to earwax buildup.  Feels that the symptoms started 2 to 3 days ago primarily on the right.  Patient has used Q-tips in the past, and feels that she has worsened her ability to hear on the right.  She also admits that her ear canals are abnormally itchy, states that psoriasis runs in her family.   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   Hx of pre-eclampsia in prior pregnancy, currently pregnant 06/23/2022    Past Surgical History:  Procedure Laterality Date   DILATION AND EVACUATION N/A 08/19/2022   Procedure: DILATATION AND EVACUATION ULTRA SOUND GUIDED;  Surgeon: Everett Graff, MD;  Location: [facility];  Service: Gynecology;  Laterality: N/A;   OPERATIVE ULTRASOUND N/A 08/19/2022   Procedure: OPERATIVE ULTRASOUND;  Surgeon: Everett Graff, MD;  Location: [facility];  Service: Gynecology;  Laterality: N/A;   PILONIDAL CYST / SINUS EXCISION     PILONIDAL CYST EXCISION     WISDOM TOOTH EXTRACTION  2013    OB History     Gravida  3   Para  3   Term  3   Preterm      AB      Living  3      SAB      IAB       Ectopic      Multiple  0   Live Births  3            Home Medications    Prior to Admission medications   Medication Sig Start Date End Date Taking? Authorizing Provider  Fluocinolone Acetonide 0.01 % OIL Place 5 drops in ear(s) in the morning and at bedtime. For 7-14 days 03/13/23  Yes Lempi Edwin L, PA  acetaminophen (TYLENOL) 325 MG tablet Take 2 tablets (650 mg total) by mouth every 4 (four) hours as needed (for pain scale < 4). 06/24/22   Arrie Eastern, CNM  ibuprofen (ADVIL) 600 MG tablet Take 1 tablet (600 mg total) by mouth every 6 (six) hours. 06/24/22   Arrie Eastern, CNM  loratadine (CLARITIN) 10 MG tablet Take 10 mg by mouth daily as needed for allergies.    [provider]  valACYclovir (VALTREX) 500 MG tablet Take 500 mg by mouth daily as needed (for outbreaks only).    [provider]  fluticasone (FLONASE) 50 MCG/ACT nasal spray Place 1-2 sprays into both nostrils daily as needed for allergies or rhinitis.  04/10/21  [provider]    Family History Family History  Problem Relation Age of Onset   Healthy Mother    Hypertension Father    Hypertension Maternal Grandmother    Hypertension Maternal Grandfather    Diabetes Maternal Grandfather    Heart disease Maternal Grandfather    Hypertension Paternal Grandmother    Hypertension Paternal Grandfather    Cancer Paternal Grandfather        tongue   Heart disease Paternal Grandfather     Social History Social History   Tobacco Use   Smoking status: Never   Smokeless tobacco: Never  Vaping Use   Vaping Use: Never used  Substance Use Topics   Alcohol use: Not Currently   Drug use: Not Currently    Types: Marijuana     Allergies   Patient has no known allergies.   Review of Systems Review of Systems As per HPI  Physical Exam Triage Vital Signs ED Triage Vitals  Enc Vitals Group     BP 03/13/23 1447 119/81     Pulse Rate 03/13/23 1447 81     Resp 03/13/23  1447 18     Temp 03/13/23 1447 98.2 F (36.8 C)     Temp Source 03/13/23 1447 Oral     SpO2 03/13/23 1447 96 %     Weight --      Height --      Head Circumference --      Peak Flow --      Pain Score 03/13/23 1445 5     Pain Loc --      Pain Edu? --      Excl. in [place]? --    No data found.  Updated Vital Signs BP 119/81 (BP Location: Right Arm)   Pulse 81   Temp 98.2 F (36.8 C) (Oral)   Resp 18   LMP 02/23/2023   SpO2 96%   Breastfeeding No   Visual Acuity Right Eye Distance:   Left Eye Distance:   Bilateral Distance:    Right Eye Near:   Left Eye Near:    Bilateral Near:     Physical Exam Vitals and nursing note reviewed.  Constitutional:      General: She is not in acute distress.    Appearance: Normal appearance. She is not ill-appearing, toxic-appearing or diaphoretic.  HENT:     Head: Normocephalic and atraumatic.     Right Ear: Decreased hearing noted. There is impacted cerumen.     Left Ear: No decreased hearing noted. There is impacted cerumen. Tympanic membrane is not injected, scarred, perforated, erythematous or bulging.     Ears:     Comments: B dry skin/ flaking to EAC  Unable to visualize TM on R due to retained cerumen Cardiovascular:     Rate and Rhythm: Normal rate.  Pulmonary:     Effort: Pulmonary effort is normal. No respiratory distress.  Musculoskeletal:     Cervical back: Normal range of motion and neck supple. No rigidity or tenderness.  Lymphadenopathy:     Cervical: No cervical adenopathy.  Neurological:     Mental Status: She is alert.      UC Treatments / Results  Labs (all labs ordered are listed, but only abnormal results are displayed) Labs Reviewed - No data to display  EKG   Radiology No results found.  Procedures Ear Cerumen Removal  Date/Time: 03/13/2023 4:31 PM  Performed by: Chaney Malling, PA Authorized by: Geryl Councilman  L, PA   Consent:    Consent obtained:  Verbal   Consent given by:  Patient    Risks, benefits, and alternatives were discussed: yes     Risks discussed:  Bleeding, infection, pain, TM perforation, incomplete removal and dizziness   Alternatives discussed:  No treatment and alternative treatment Universal protocol:    Patient identity confirmed:  Verbally with patient Procedure details:    Location:  L ear and R ear   Procedure type: curette     Procedure outcomes: cerumen removed (L ear canal clear; R ear canal with mild degree of cerumen remaining)   Post-procedure details:    Inspection:  Some cerumen remaining, no bleeding and TM intact   Hearing quality:  Improved   Procedure completion:  Tolerated  (including critical care time)  Medications Ordered in UC Medications  carbamide peroxide (DEBROX) 6.5 % OTIC (EAR) solution 5 drop (5 drops Both EARS Given 03/13/23 1517)    Initial Impression / Assessment and Plan / UC Course  I have reviewed the triage vital signs and the nursing notes.  Pertinent labs & imaging results that were available during my care of the patient were reviewed by me and considered in my medical decision making (see chart for details).     B cerumen impaction - complete removal on L, partial on R. Both irrigation and curette used for removal. Pt sent home with remaining bottle of debrox. Eczematous OE - topical fluocinolone otic drops   Final Clinical Impressions(s) / UC Diagnoses   Final diagnoses:  Bilateral impacted cerumen  Acute eczematoid otitis externa of both ears     Discharge Instructions      Please purchase the NeilMed clear canal kit. Use the Debrox given today, and apply 10 drops in the right ear canal twice daily for the next 4 days. On the fifth day, try to flush out your right ear canal with the saline irrigation kit.  Use the fluocinolone otic oil as prescribed, as needed for dry skin or otic irritation.     ED Prescriptions     Medication Sig Dispense Auth. Provider   Fluocinolone Acetonide 0.01 %  OIL Place 5 drops in ear(s) in the morning and at bedtime. For 7-14 days 20 mL Dotti Busey L, PA      PDMP not reviewed this encounter.   Chaney Malling, [place] 03/13/23 1644

## 2023-03-13 NOTE — ED Triage Notes (Signed)
Patient c/o right ear pain/ fullness that began about 2-3 days ago .   Home interventions: ear irrigation

## 2023-09-15 ENCOUNTER — Ambulatory Visit
Admission: EM | Admit: 2023-09-15 | Discharge: 2023-09-15 | Disposition: A | Discharge status: Home / Self Care | Payer: BLUE CROSS/BLUE SHIELD | Attending: Internal Medicine | Admitting: Internal Medicine

## 2023-09-15 DIAGNOSIS — R21 Rash and other nonspecific skin eruption: Secondary | ICD-10-CM | POA: Diagnosis not present

## 2023-09-15 MED ORDER — TRIAMCINOLONE ACETONIDE 0.025 % EX OINT: 1.0000 | TOPICAL_OINTMENT | Freq: Two times a day (BID) | CUTANEOUS | 0 refills | Status: AC

## 2023-09-15 MED ORDER — SALICYLIC ACID 3 % EX SHAM: 1.0000 | MEDICATED_SHAMPOO | CUTANEOUS | 0 refills | Status: DC

## 2023-09-15 NOTE — Discharge Instructions (Signed)
Start triamcinolone ointment to areas of face twice daily as needed.  Use sparingly and for no longer than 7 days.  You may use salicylic acid shampoo 2 times a week.  Please follow-up with the PCP for further treatment options.  I hope you feel better soon!

## 2023-09-15 NOTE — ED Provider Notes (Signed)
[facility]    CSN: [id number] Arrival date & time: 09/15/23  8119      History   Chief Complaint Chief Complaint  Patient presents with   Rash    HPI Stacie Simpson is a 27 y.o. female presents for rash.  Patient reports flaky/pruritic rash on scalp, eyebrows, and ear for 3 days.  Denies any new contacts including soaps, medications, lotions, make-up, etc.  Reports her father has psoriasis.  Patient denies any history of psoriasis or eczema.  She is also requesting a referral for evaluation for weight loss surgery.  She does not currently have a PCP.  No other concerns at this time.   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   Hx of pre-eclampsia in prior pregnancy, currently pregnant 06/23/2022    Past Surgical History:  Procedure Laterality Date   DILATION AND EVACUATION N/A 08/19/2022   Procedure: DILATATION AND EVACUATION ULTRA SOUND GUIDED;  Surgeon: Osborn Coho, MD;  Location: [facility] OR;  Service: Gynecology;  Laterality: N/A;   OPERATIVE ULTRASOUND N/A 08/19/2022   Procedure: OPERATIVE ULTRASOUND;  Surgeon: Osborn Coho, MD;  Location: [facility] OR;  Service: Gynecology;  Laterality: N/A;   PILONIDAL CYST / SINUS EXCISION     PILONIDAL CYST EXCISION     WISDOM TOOTH EXTRACTION  2013    OB History     Gravida  3   Para  3   Term  3   Preterm      AB      Living  3      SAB      IAB      Ectopic       Multiple  0   Live Births  3            Home Medications    Prior to Admission medications   Medication Sig Start Date End Date Taking? Authorizing Provider  Salicylic Acid 3 % SHAM Apply 1 Application topically 2 (two) times a week. 09/18/23  Yes Radford Pax, NP  triamcinolone (KENALOG) 0.025 % ointment Apply 1 Application topically 2 (two) times daily. 09/15/23  Yes Radford Pax, NP  acetaminophen (TYLENOL) 325 MG tablet Take 2 tablets (650 mg total) by mouth every 4 (four) hours as needed (for pain scale < 4). 06/24/22   Roma Schanz, CNM  Fluocinolone Acetonide 0.01 % OIL Place 5 drops in ear(s) in the morning and at bedtime. For 7-14 days 03/13/23   Guy Sandifer L, PA  ibuprofen (ADVIL) 600 MG tablet Take 1 tablet (600 mg total) by mouth every 6 (six) hours. 06/24/22   Roma Schanz, CNM  loratadine (CLARITIN) 10 MG tablet Take 10 mg by mouth daily as needed for allergies.    [provider]  valACYclovir (VALTREX) 500 MG tablet Take 500 mg by  mouth daily as needed (for outbreaks only).    [provider]  fluticasone (FLONASE) 50 MCG/ACT nasal spray Place 1-2 sprays into both nostrils daily as needed for allergies or rhinitis.  04/10/21  [provider]    Family History Family History  Problem Relation Age of Onset   Healthy Mother    Hypertension Father    Hypertension Maternal Grandmother    Hypertension Maternal Grandfather    Diabetes Maternal Grandfather    Heart disease Maternal Grandfather    Hypertension Paternal Grandmother    Hypertension Paternal Grandfather    Cancer Paternal Grandfather        tongue   Heart disease Paternal Grandfather     Social History Social History   Tobacco Use   Smoking status: Never   Smokeless tobacco: Never  Vaping Use   Vaping status: Never Used  Substance Use Topics   Alcohol use: Not Currently   Drug use: Not Currently    Types: Marijuana     Allergies   Patient has no known  allergies.   Review of Systems Review of Systems  Skin:  Positive for rash.     Physical Exam Triage Vital Signs ED Triage Vitals  Encounter Vitals Group     BP 09/15/23 0848 115/87     Systolic BP Percentile --      Diastolic BP Percentile --      Pulse Rate 09/15/23 0848 (!) 107     Resp 09/15/23 0848 18     Temp 09/15/23 0848 98.6 F (37 C)     Temp Source 09/15/23 0848 Oral     SpO2 09/15/23 0848 97 %     Weight --      Height --      Head Circumference --      Peak Flow --      Pain Score 09/15/23 0852 5     Pain Loc --      Pain Education --      Exclude from Growth Chart --    No data found.  Updated Vital Signs BP 115/87 (BP Location: Left Arm)   Pulse (!) 107   Temp 98.6 F (37 C) (Oral)   Resp 18   LMP  (LMP Unknown)   SpO2 97%   Breastfeeding No   Visual Acuity Right Eye Distance:   Left Eye Distance:   Bilateral Distance:    Right Eye Near:   Left Eye Near:    Bilateral Near:     Physical Exam Vitals and nursing note reviewed.  Constitutional:      Appearance: Normal appearance.  HENT:     Head: Normocephalic and atraumatic.  Eyes:     Pupils: Pupils are equal, round, and reactive to light.  Cardiovascular:     Rate and Rhythm: Normal rate.  Pulmonary:     Effort: Pulmonary effort is normal.  Skin:    General: Skin is warm and dry.          Comments: There is dry flaky skin around both eyebrows as well as to the scalp.  There is a plaque type rash in front of her right ear.  No swelling, drainage, warmth.  Neurological:     General: No focal deficit present.     Mental Status: She is alert and oriented to person, place, and time.  Psychiatric:        Mood and Affect: Mood normal.        Behavior: Behavior  normal.      UC Treatments / Results  Labs (all labs ordered are listed, but only abnormal results are displayed) Labs Reviewed - No data to display  EKG   Radiology No results found.  Procedures Procedures  (including critical care time)  Medications Ordered in UC Medications - No data to display  Initial Impression / Assessment and Plan / UC Course  I have reviewed the triage vital signs and the nursing notes.  Pertinent labs & imaging results that were available during my care of the patient were reviewed by me and considered in my medical decision making (see chart for details).     Reviewed exam and symptoms with patient.  No red flags.  Patient with new onset rash with family history of psoriasis.  Will do low-dose triamcinolone to affected areas on face.  Patient advised to use sparingly and no more than 7 days.  Will also do salicylic shampoo to scalp twice a week.  Nursing staff was able to help patient establish with a PCP where she will follow-up for further treatment and workup.  Did give her contact information for [facility] as she requested for evaluation of bariatric surgery.  ER precautions reviewed and patient verbalized understanding. Final Clinical Impressions(s) / UC Diagnoses   Final diagnoses:  Rash     Discharge Instructions      Start triamcinolone ointment to areas of face twice daily as needed.  Use sparingly and for no longer than 7 days.  You may use salicylic acid shampoo 2 times a week.  Please follow-up with the PCP for further treatment options.  I hope you feel better soon!     ED Prescriptions     Medication Sig Dispense Auth. Provider   triamcinolone (KENALOG) 0.025 % ointment Apply 1 Application topically 2 (two) times daily. 30 g Radford Pax, NP   Salicylic Acid 3 % SHAM Apply 1 Application topically 2 (two) times a week. 118 mL Radford Pax, NP      PDMP not reviewed this encounter.   Radford Pax, NP 09/15/23 [phone]

## 2023-09-15 NOTE — ED Triage Notes (Signed)
Pt presents with flaky patches on her forehead and around her eyebrow, ear and scalp that burns and itches that started 3 days ago.

## 2023-10-04 ENCOUNTER — Encounter: Payer: Self-pay | Admitting: Nurse Practitioner

## 2023-10-04 ENCOUNTER — Ambulatory Visit: Payer: BLUE CROSS/BLUE SHIELD | Admitting: Nurse Practitioner

## 2023-10-04 ENCOUNTER — Telehealth: Payer: Self-pay | Admitting: Nurse Practitioner

## 2023-10-04 VITALS — BP 122/80 | HR 88 | Temp 97.2°F | Ht 70.0 in | Wt 283.0 lb

## 2023-10-04 DIAGNOSIS — L409 Psoriasis, unspecified: Secondary | ICD-10-CM

## 2023-10-04 DIAGNOSIS — Z23 Encounter for immunization: Secondary | ICD-10-CM

## 2023-10-04 DIAGNOSIS — R5383 Other fatigue: Secondary | ICD-10-CM | POA: Diagnosis not present

## 2023-10-04 DIAGNOSIS — Z13220 Encounter for screening for lipoid disorders: Secondary | ICD-10-CM | POA: Diagnosis not present

## 2023-10-04 DIAGNOSIS — Z6841 Body Mass Index (BMI) 40.0 and over, adult: Secondary | ICD-10-CM

## 2023-10-04 MED ORDER — WEGOVY 0.25 MG/0.5ML ~~LOC~~ SOAJ
0.2500 mg | SUBCUTANEOUS | 1 refills | Status: DC
Start: 2023-10-04 — End: 2024-02-23

## 2023-10-04 NOTE — Telephone Encounter (Signed)
Pt is having difficulty getting her Wegovy. She would like it sent to [facility] at [facility]. Please advise pt at  [phone]

## 2023-10-04 NOTE — Assessment & Plan Note (Signed)
She reports changes in skin on the face, noting that salicylic acid has been helping. We will continue the current treatment and monitor for any changes.

## 2023-10-04 NOTE — Assessment & Plan Note (Signed)
She reports feeling sluggish, experiencing brain fog, and occasional dizziness, with a family history of heart disease and diabetes. Labs will be checked to assess for any underlying conditions contributing to these symptoms including CMP, CBC, TSH, A1c, vitamin B12, and vitamin D.

## 2023-10-04 NOTE — Assessment & Plan Note (Signed)
BMI 40.6. She reports significant weight gain and difficulty losing weight despite attempts at dieting and exercise, expressing interest in medication for weight loss. We will start Semaglutide [facility]) once weekly injection, pending insurance approval, and encourage continued diet and exercise. Labs will be checked to rule out any underlying conditions contributing to weight gain, with a follow-up in 4-6 weeks to assess response to medication.

## 2023-10-04 NOTE — Progress Notes (Signed)
New Patient Visit  BP 122/80 (BP Location: Left Arm)   Pulse 88   Temp (!) 97.2 F (36.2 C)   Ht 5\' 10"  (1.778 m)   Wt 283 lb (128.4 kg)   LMP  (LMP Unknown)   SpO2 97%   Breastfeeding No   BMI 40.61 kg/m    Subjective:    Patient ID: Stacie Simpson, female    DOB: October 14, 1996, 27 y.o.   MRN: [id number]  CC: Chief Complaint  Patient presents with   Establish Care    NP. Est. Care, concerns with weight loss options, flu vaccine    HPI: Stacie Simpson is a 27 y.o. female presents for new patient visit to establish care.  Introduced to Publishing rights manager role and practice setting.  All questions answered.  Discussed provider/patient relationship and expectations.  Discussed the use of AI scribe software for clinical note transcription with the patient, who gave verbal consent to proceed.  History of Present Illness   Stacie Simpson, a mother of three, presents with concerns about weight gain, fatigue, brain fog, and occasional dizziness. She reports that she has tried various methods to lose weight, including fasting and walking, but has not seen significant results. She expresses frustration with her inability to lose weight despite eating similar portions to her family members and being active. She also reports feeling "out of whack" and sluggish, which she believes may be related to her weight.  In addition to her primary concerns, Stacie Simpson mentions having some sinus issues and itchy skin, which she attributes to psoriasis. She has been using salicylic acid for her skin, which she reports has been helping. She also mentions having a history of irregular periods, but is currently using a Mirena IUD for birth control and does not have periods.  Stacie Simpson has a family history of heart disease, diabetes, and high cholesterol. She also mentions that her father had weight loss surgery when she was younger and that her mother also struggles with her weight. She does not smoke or vape and prefers to try  natural remedies before resorting to medication.     Depression and Anxiety Screen done:     10/04/2023    3:00 PM 10/07/2014    9:48 AM  Depression screen PHQ 2/9  Decreased Interest 0 0  Down, Depressed, Hopeless 0 0  PHQ - 2 Score 0 0  Altered sleeping 0   Tired, decreased energy 1   Change in appetite 0   Feeling bad or failure about yourself  0   Trouble concentrating 1   Moving slowly or fidgety/restless 0   Suicidal thoughts 0   PHQ-9 Score 2   Difficult doing work/chores Not difficult at all       10/04/2023    3:01 PM  GAD 7 : Generalized Anxiety Score  Nervous, Anxious, on Edge 1  Control/stop worrying 0  Worry too much - different things 0  Trouble relaxing 0  Restless 0  Easily annoyed or irritable 1  Afraid - awful might happen 0  Total GAD 7 Score 2  Anxiety Difficulty Not difficult at all    Past Medical History:  Diagnosis Date   Anemia    Anxiety    COVID    x 2 mild cases   Depression    GERD (gastroesophageal reflux disease)    HSV-2 infection    Obesity    Pregnancy induced hypertension    1st pregnancy only   TMJ (temporomandibular joint disorder)  Vision abnormalities    Pt wears contacts    Past Surgical History:  Procedure Laterality Date   DILATION AND EVACUATION N/A 08/19/2022   Procedure: DILATATION AND EVACUATION ULTRA SOUND GUIDED;  Surgeon: Osborn Coho, MD;  Location: [facility] OR;  Service: Gynecology;  Laterality: N/A;   OPERATIVE ULTRASOUND N/A 08/19/2022   Procedure: OPERATIVE ULTRASOUND;  Surgeon: Osborn Coho, MD;  Location: [facility] OR;  Service: Gynecology;  Laterality: N/A;   PILONIDAL CYST / SINUS EXCISION     PILONIDAL CYST EXCISION     WISDOM TOOTH EXTRACTION  2013    Family History  Problem Relation Age of Onset   Healthy Mother    Hypertension Father    Obesity Father    Hypertension Maternal Grandmother    Hypertension Maternal Grandfather    Diabetes Maternal Grandfather    Heart disease Maternal  Grandfather    Hypertension Paternal Grandmother    Hypertension Paternal Grandfather    Cancer Paternal Grandfather        tongue   Heart disease Paternal Grandfather      Social History   Tobacco Use   Smoking status: Never   Smokeless tobacco: Never  Vaping Use   Vaping status: Never Used  Substance Use Topics   Alcohol use: Not Currently   Drug use: Not Currently    Types: Marijuana    Current Outpatient Medications on File Prior to Visit  Medication Sig Dispense Refill   acetaminophen (TYLENOL) 325 MG tablet Take 2 tablets (650 mg total) by mouth every 4 (four) hours as needed (for pain scale < 4).     ibuprofen (ADVIL) 600 MG tablet Take 1 tablet (600 mg total) by mouth every 6 (six) hours. 30 tablet 0   loratadine (CLARITIN) 10 MG tablet Take 10 mg by mouth daily as needed for allergies.     Salicylic Acid 3 % SHAM Apply 1 Application topically 2 (two) times a week. 118 mL 0   triamcinolone (KENALOG) 0.025 % ointment Apply 1 Application topically 2 (two) times daily. 30 g 0   valACYclovir (VALTREX) 500 MG tablet Take 500 mg by mouth daily as needed (for outbreaks only).     [DISCONTINUED] fluticasone (FLONASE) 50 MCG/ACT nasal spray Place 1-2 sprays into both nostrils daily as needed for allergies or rhinitis.     No current facility-administered medications on file prior to visit.     Review of Systems  Constitutional:  Positive for fatigue. Negative for fever.  HENT:  Positive for congestion.   Respiratory: Negative.    Cardiovascular: Negative.   Gastrointestinal:  Positive for constipation (intermittent), diarrhea (intermittent) and nausea (intermittent). Negative for abdominal pain.  Genitourinary: Negative.   Musculoskeletal: Negative.   Skin:        Dry skin on forehead  Allergic/Immunologic: Positive for environmental allergies.  Neurological:  Positive for dizziness (today). Negative for headaches.  Psychiatric/Behavioral: Negative.        Objective:     BP 122/80 (BP Location: Left Arm)   Pulse 88   Temp (!) 97.2 F (36.2 C)   Ht 5\' 10"  (1.778 m)   Wt 283 lb (128.4 kg)   LMP  (LMP Unknown)   SpO2 97%   Breastfeeding No   BMI 40.61 kg/m   Wt Readings from Last 3 Encounters:  10/04/23 283 lb (128.4 kg)  08/19/22 255 lb (115.7 kg)  06/23/22 268 lb 15.4 oz (122 kg)    BP Readings from Last 3 Encounters:  10/04/23 122/80  09/15/23 115/87  03/13/23 119/81    Physical Exam Vitals and nursing note reviewed.  Constitutional:      General: She is not in acute distress.    Appearance: Normal appearance. She is obese.  HENT:     Head: Normocephalic and atraumatic.     Right Ear: Tympanic membrane, ear canal and external ear normal.     Left Ear: Tympanic membrane, ear canal and external ear normal.     Mouth/Throat:     Mouth: Mucous membranes are moist.     Pharynx: No posterior oropharyngeal erythema.  Eyes:     Conjunctiva/sclera: Conjunctivae normal.  Cardiovascular:     Rate and Rhythm: Normal rate and regular rhythm.     Pulses: Normal pulses.     Heart sounds: Normal heart sounds.  Pulmonary:     Effort: Pulmonary effort is normal.     Breath sounds: Normal breath sounds.  Abdominal:     Palpations: Abdomen is soft.     Tenderness: There is no abdominal tenderness.  Musculoskeletal:        General: Normal range of motion.     Cervical back: Normal range of motion and neck supple.     Right lower leg: No edema.     Left lower leg: No edema.  Lymphadenopathy:     Cervical: No cervical adenopathy.  Skin:    General: Skin is warm and dry.  Neurological:     General: No focal deficit present.     Mental Status: She is alert and oriented to person, place, and time.     Cranial Nerves: No cranial nerve deficit.     Coordination: Coordination normal.     Gait: Gait normal.  Psychiatric:        Mood and Affect: Mood normal.        Behavior: Behavior normal.        Thought Content: Thought content normal.         Judgment: Judgment normal.        Assessment & Plan:   Problem List Items Addressed This Visit       Musculoskeletal and Integument   Psoriasis    She reports changes in skin on the face, noting that salicylic acid has been helping. We will continue the current treatment and monitor for any changes.         Other   Fatigue - Primary    She reports feeling sluggish, experiencing brain fog, and occasional dizziness, with a family history of heart disease and diabetes. Labs will be checked to assess for any underlying conditions contributing to these symptoms including CMP, CBC, TSH, A1c, vitamin B12, and vitamin D.       Relevant Orders   CBC with Differential/Platelet   Comprehensive metabolic panel   Hemoglobin A1c   VITAMIN D 25 Hydroxy (Vit-D Deficiency, Fractures)   Vitamin B12   TSH   Morbid obesity (HCC)    BMI 40.6. She reports significant weight gain and difficulty losing weight despite attempts at dieting and exercise, expressing interest in medication for weight loss. We will start Semaglutide [facility]) once weekly injection, pending insurance approval, and encourage continued diet and exercise. Labs will be checked to rule out any underlying conditions contributing to weight gain, with a follow-up in 4-6 weeks to assess response to medication.      Relevant Medications   Semaglutide-Weight Management (WEGOVY) 0.25 MG/0.5ML SOAJ   Other Relevant Orders   CBC with Differential/Platelet  Comprehensive metabolic panel   Hemoglobin A1c   TSH   Other Visit Diagnoses     Screening, lipid       Screen lipid panel today   Relevant Orders   Lipid panel   Immunization due       Flu vaccine given today   Relevant Orders   Flu vaccine trivalent PF, 6mos and older(Flulaval,Afluria,Fluarix,Fluzone) (Completed)       Follow up plan: Return in about 6 weeks (around 11/15/2023) for 6-8 weeks, weight management.  Diamonique Ruedas A Shanesha Bednarz

## 2023-10-04 NOTE — Patient Instructions (Signed)
It was great to see you!  We are checking your labs today and will let you know the results via mychart/phone.   Start wegovy injection once a week. Make sure you are drinking plenty of water  You can take miralax as needed for constipation.   Let's follow-up in 6-8 weeks, sooner if you have concerns.  If a referral was placed today, you will be contacted for an appointment. Please note that routine referrals can sometimes take up to 3-4 weeks to process. Please call our office if you haven't heard anything after this time frame.  Take care,  Rodman Pickle, NP

## 2023-10-05 ENCOUNTER — Other Ambulatory Visit (HOSPITAL_COMMUNITY): Payer: Self-pay

## 2023-10-05 LAB — HEMOGLOBIN A1C: Hgb A1c MFr Bld: 4.9 % (ref 4.6–6.5)

## 2023-10-05 LAB — CBC WITH DIFFERENTIAL/PLATELET
Basophils Absolute: 0 10*3/uL (ref 0.0–0.1)
Basophils Relative: 0.7 % (ref 0.0–3.0)
Eosinophils Absolute: 0.1 10*3/uL (ref 0.0–0.7)
Eosinophils Relative: 1.1 % (ref 0.0–5.0)
HCT: 38.7 % (ref 36.0–46.0)
Hemoglobin: 12.3 g/dL (ref 12.0–15.0)
Lymphocytes Relative: 52.6 % — ABNORMAL HIGH (ref 12.0–46.0)
Lymphs Abs: 2.6 10*3/uL (ref 0.7–4.0)
MCHC: 31.8 g/dL (ref 30.0–36.0)
MCV: 86.2 fL (ref 78.0–100.0)
Monocytes Absolute: 0.5 10*3/uL (ref 0.1–1.0)
Monocytes Relative: 11.1 % (ref 3.0–12.0)
Neutro Abs: 1.7 10*3/uL (ref 1.4–7.7)
Neutrophils Relative %: 34.5 % — ABNORMAL LOW (ref 43.0–77.0)
Platelets: 250 10*3/uL (ref 150.0–400.0)
RBC: 4.49 Mil/uL (ref 3.87–5.11)
RDW: 12.7 % (ref 11.5–15.5)
WBC: 4.9 10*3/uL (ref 4.0–10.5)

## 2023-10-05 LAB — LIPID PANEL
Cholesterol: 140 mg/dL (ref 0–200)
HDL: 45.4 mg/dL (ref >39.00)
LDL Cholesterol: 60 mg/dL (ref 0–99)
NonHDL: 94.95
Total CHOL/HDL Ratio: 3
Triglycerides: 174 mg/dL — ABNORMAL HIGH (ref 0.0–149.0)
VLDL: 34.8 mg/dL (ref 0.0–40.0)

## 2023-10-05 LAB — COMPREHENSIVE METABOLIC PANEL
ALT: 16 U/L (ref 0–35)
AST: 16 U/L (ref 0–37)
Albumin: 4.1 g/dL (ref 3.5–5.2)
Alkaline Phosphatase: 94 U/L (ref 39–117)
BUN: 11 mg/dL (ref 6–23)
CO2: 26 meq/L (ref 19–32)
Calcium: 8.7 mg/dL (ref 8.4–10.5)
Chloride: 101 meq/L (ref 96–112)
Creatinine, Ser: 0.75 mg/dL (ref 0.40–1.20)
GFR: 108.55 mL/min (ref >60.00)
Glucose, Bld: 87 mg/dL (ref 70–99)
Potassium: 3.7 meq/L (ref 3.5–5.1)
Sodium: 134 meq/L — ABNORMAL LOW (ref 135–145)
Total Bilirubin: 0.4 mg/dL (ref 0.2–1.2)
Total Protein: 7.2 g/dL (ref 6.0–8.3)

## 2023-10-05 LAB — VITAMIN B12: Vitamin B-12: 118 pg/mL — ABNORMAL LOW (ref 211–911)

## 2023-10-05 LAB — VITAMIN D 25 HYDROXY (VIT D DEFICIENCY, FRACTURES): VITD: 7.35 ng/mL — ABNORMAL LOW (ref 30.00–100.00)

## 2023-10-05 LAB — TSH: TSH: 1.87 u[IU]/mL (ref 0.35–5.50)

## 2023-10-05 MED ORDER — VITAMIN D (ERGOCALCIFEROL) 1.25 MG (50000 UNIT) PO CAPS: 50000.0000 [IU] | ORAL_CAPSULE | ORAL | 1 refills | Status: DC

## 2023-10-05 NOTE — Addendum Note (Signed)
Addended by: Rodman Pickle A on: 10/05/2023 02:56 PM   Modules accepted: Orders

## 2023-10-05 NOTE — Telephone Encounter (Signed)
Patient also sent a Mychart message and responded through there.

## 2023-10-09 ENCOUNTER — Telehealth: Payer: Self-pay

## 2023-10-09 NOTE — Telephone Encounter (Signed)
Prior Berkley Harvey is requested for [facility] 0.25mg /0.13ml.  Please call plan at [phone] to initiate a prior auth.  Patient ID is [id number]

## 2023-10-10 ENCOUNTER — Telehealth: Payer: Self-pay

## 2023-10-10 ENCOUNTER — Other Ambulatory Visit (HOSPITAL_COMMUNITY): Payer: Self-pay

## 2023-10-10 NOTE — Telephone Encounter (Signed)
Pharmacy Patient Advocate Encounter   Received notification from Pt Calls Messages that prior authorization for Wegovy 0.25mg /0.75ml is required/requested.   Insurance verification completed.   The patient is insured through  [id number]  .   Per test claim:  Placed a call to the insurance at [phone], per the representative, Reginal Lutes is not covered by the plan, it is a plan exclusion.

## 2023-10-11 ENCOUNTER — Other Ambulatory Visit (HOSPITAL_COMMUNITY): Payer: Self-pay

## 2023-10-11 NOTE — Telephone Encounter (Signed)
Patient would like to try the Contrave and can send to [facility] on [address].

## 2023-10-11 NOTE — Telephone Encounter (Signed)
I called [facility] and spoke with pharmacy tech and she tried to run patient's Rx and it comes up saying that patient has another Publishing copy and they need patient's insurance card. Also the tech said that the Rx is not instock.  I called patient and notified her of above message and she would like to try another alternative since there seems to be an outage of medication she said in this strength from here to [place].

## 2023-10-11 NOTE — Telephone Encounter (Signed)
Pharmacy Patient Advocate Encounter   Received notification from Pt Calls Messages that prior authorization for Wegovy 0.25mg /0.10ml is required/requested.   Insurance verification completed.   The patient is insured through [facility] .   Per test claim:  Per the prior auth, submit bill to other processor or primary payer. Called [facility] to see if they could get it to go thru to [employer] and the pharmacist stated he kept getting a reject saying to bill primary insurance.

## 2023-10-12 MED ORDER — CONTRAVE 8-90 MG PO TB12: ORAL_TABLET | ORAL | 0 refills | Status: DC

## 2023-10-12 NOTE — Telephone Encounter (Signed)
Done

## 2023-10-12 NOTE — Addendum Note (Signed)
Addended by: Rodman Pickle A on: 10/12/2023 08:16 AM   Modules accepted: Orders

## 2023-10-30 MED ORDER — PHENTERMINE HCL 30 MG PO CAPS: 30.0000 mg | ORAL_CAPSULE | ORAL | 0 refills | Status: DC

## 2023-11-10 ENCOUNTER — Other Ambulatory Visit (HOSPITAL_COMMUNITY): Payer: Self-pay

## 2023-11-15 ENCOUNTER — Ambulatory Visit: Payer: BLUE CROSS/BLUE SHIELD | Admitting: Nurse Practitioner

## 2023-11-27 ENCOUNTER — Ambulatory Visit: Payer: BLUE CROSS/BLUE SHIELD | Admitting: Nurse Practitioner

## 2023-11-27 ENCOUNTER — Telehealth: Payer: Self-pay | Admitting: Nurse Practitioner

## 2023-11-27 NOTE — Telephone Encounter (Signed)
12.16.24 no show

## 2023-11-29 NOTE — Telephone Encounter (Signed)
Noted  

## 2023-11-29 NOTE — Telephone Encounter (Signed)
1st no show, letter sent via [facility]

## 2024-02-23 ENCOUNTER — Ambulatory Visit: Admitting: Nurse Practitioner

## 2024-02-23 ENCOUNTER — Encounter: Payer: Self-pay | Admitting: Nurse Practitioner

## 2024-02-23 VITALS — BP 128/80 | HR 84 | Temp 97.9°F | Ht 70.0 in | Wt 279.6 lb

## 2024-02-23 DIAGNOSIS — M25473 Effusion, unspecified ankle: Secondary | ICD-10-CM | POA: Insufficient documentation

## 2024-02-23 DIAGNOSIS — R21 Rash and other nonspecific skin eruption: Secondary | ICD-10-CM | POA: Insufficient documentation

## 2024-02-23 DIAGNOSIS — E559 Vitamin D deficiency, unspecified: Secondary | ICD-10-CM | POA: Diagnosis not present

## 2024-02-23 DIAGNOSIS — E538 Deficiency of other specified B group vitamins: Secondary | ICD-10-CM | POA: Diagnosis not present

## 2024-02-23 DIAGNOSIS — R5383 Other fatigue: Secondary | ICD-10-CM

## 2024-02-23 DIAGNOSIS — J069 Acute upper respiratory infection, unspecified: Secondary | ICD-10-CM | POA: Insufficient documentation

## 2024-02-23 DIAGNOSIS — L0291 Cutaneous abscess, unspecified: Secondary | ICD-10-CM | POA: Insufficient documentation

## 2024-02-23 MED ORDER — DOXYCYCLINE HYCLATE 100 MG PO TABS
100.0000 mg | ORAL_TABLET | Freq: Two times a day (BID) | ORAL | 0 refills | Status: DC
Start: 2024-02-23 — End: 2024-04-29

## 2024-02-23 MED ORDER — KETOCONAZOLE 2 % EX SHAM: 1.0000 | MEDICATED_SHAMPOO | CUTANEOUS | 0 refills | Status: AC

## 2024-02-23 MED ORDER — ALBUTEROL SULFATE HFA 108 (90 BASE) MCG/ACT IN AERS: 2.0000 | INHALATION_SPRAY | Freq: Four times a day (QID) | RESPIRATORY_TRACT | 0 refills | Status: DC | PRN

## 2024-02-23 NOTE — Assessment & Plan Note (Signed)
 An itchy, painful, scabbing rash on the scalp is present. Previous treatment with salicylic acid shampoo caused burning. Prescribe ketoconazole shampoo twice a week and advise avoiding contact with the eyes. Check ANA today.

## 2024-02-23 NOTE — Assessment & Plan Note (Signed)
 BMI 40.1. She states that the phentermine did not help with weight loss. She would like to focus on her fatigue first and then her weight. Check FSH, LH, testosterone, TSH today.

## 2024-02-23 NOTE — Assessment & Plan Note (Signed)
 She has not been taking vitamin D regularly. Encouraged her to take this once a week and set an alarm to remember. Check vitamin D levels today.

## 2024-02-23 NOTE — Progress Notes (Signed)
 Established Patient Office Visit  Subjective   Patient ID: Stacie Simpson, female    DOB: [birth year]  Age: 28 y.o. MRN: [id number]  Chief Complaint  Patient presents with   Fatigue    Extreme fatigue, swollen lymph nodes axilla, swelling in ankles, rash on face, weight concerns, scalp irritation    HPI  Discussed the use of AI scribe software for clinical note transcription with the patient, who gave verbal consent to proceed.  History of Present Illness   The patient, with a history of low vitamin D and B12 levels, presents with multiple complaints. The chief complaint is a persistent feeling of fatigue, despite adequate sleep. The patient reports waking up tired and wanting to sleep more, especially on weekends and days off. The patient also reports random hip and back pain.  The patient has been experiencing a scalp rash, initially thought to be psoriasis, but is now concerned it might be something more serious. The rash is itchy, painful when scratched, and feels dry. The patient has tried prescribed and over-the-counter shampoos (salicyclic acid shampoo) for psoriasis, but she caused a burning sensation.  The patient also reports swollen lymph nodes in both armpits, with the right side being more swollen and painful. The swelling has been present for about a week and a half. The patient also reports swelling in the ankles and feet, which occurs randomly and is not related to the time of day. She denies fevers, changes in breasts and nipple discharge.   The patient has been fighting a cold for about three weeks, with symptoms of congestion and coughing. The patient wakes up in the middle of the night coughing and struggling to breathe. The patient also reports a sore throat, which has since resolved. She has a history of childhood asthma.   The patient has been struggling with her weight, even with taking phentermine, however she would like to address her other concerns first.         ROS See pertinent positives and negatives per HPI.    Objective:     BP 128/80 (BP Location: Left Arm, Patient Position: Sitting, Cuff Size: Normal)   Pulse 84   Temp 97.9 F (36.6 C)   Ht 5\' 10"  (1.778 m)   Wt 279 lb 9.6 oz (126.8 kg)   LMP 01/25/2024 (Approximate)   SpO2 98%   Breastfeeding No   BMI 40.12 kg/m    Physical Exam Vitals and nursing note reviewed.  Constitutional:      General: She is not in acute distress.    Appearance: Normal appearance.  HENT:     Head: Normocephalic.     Right Ear: External ear normal. There is impacted cerumen.     Left Ear: External ear normal. There is impacted cerumen.     Mouth/Throat:     Mouth: Mucous membranes are moist.     Pharynx: No posterior oropharyngeal erythema.  Eyes:     Conjunctiva/sclera: Conjunctivae normal.  Cardiovascular:     Rate and Rhythm: Normal rate and regular rhythm.     Pulses: Normal pulses.     Heart sounds: Normal heart sounds.  Pulmonary:     Effort: Pulmonary effort is normal.     Breath sounds: Normal breath sounds.  Musculoskeletal:     Cervical back: Normal range of motion and neck supple. No tenderness.  Lymphadenopathy:     Cervical: No cervical adenopathy.  Skin:    General: Skin is warm.  Findings: Rash (dry scaly rash to forehead, scalp, and behind right ear) present.     Comments: Small abscess under right axilla, larger abscess under left axilla. No areas of fluctuance  Neurological:     General: No focal deficit present.     Mental Status: She is alert and oriented to person, place, and time.  Psychiatric:        Mood and Affect: Mood normal.        Behavior: Behavior normal.        Thought Content: Thought content normal.        Judgment: Judgment normal.      Assessment & Plan:   Problem List Items Addressed This Visit       Respiratory   Upper respiratory tract infection   She experiences persistent congestion, coughing, and nocturnal shortness of  breath. An inhaler is prescribed for nighttime use as needed. Continue symptomatic treatment with over-the-counter medications.      Relevant Medications   ketoconazole (NIZORAL) 2 % shampoo (Start on 02/26/2024)     Musculoskeletal and Integument   Rash   An itchy, painful, scabbing rash on the scalp is present. Previous treatment with salicylic acid shampoo caused burning. Prescribe ketoconazole shampoo twice a week and advise avoiding contact with the eyes. Check ANA today.       Relevant Orders   ANA w/Reflex     Other   Fatigue   Chronic fatigue with low energy levels is noted, with deficiencies in vitamin D and B12. Possible sleep apnea is suspected due to snoring. Vitamin supplementation benefits were discussed. Encourage adherence to weekly vitamin D supplementation and start vitamin B12 1000 mcg daily. Refer to Sleep Medicine for a home sleep study. Check vitamin B12, D, TSH, CMP, CBC today.       Relevant Orders   CBC with Differential/Platelet   Comprehensive metabolic panel   Ambulatory referral to Sleep Studies   Morbid obesity (HCC)   BMI 40.1. She states that the phentermine did not help with weight loss. She would like to focus on her fatigue first and then her weight. Check FSH, LH, testosterone, TSH today.       Relevant Orders   TSH   FSH/LH   Testosterone   Ankle swelling   Swelling in ankles, feet, and hands is likely due to venous insufficiency. Recommend wearing compression socks during the day, elevating feet when sitting, and reducing salt intake. Check BNP today.       Relevant Orders   B Nat Peptide   Vitamin D deficiency   She has not been taking vitamin D regularly. Encouraged her to take this once a week and set an alarm to remember. Check vitamin D levels today.       Relevant Orders   VITAMIN D 25 Hydroxy (Vit-D Deficiency, Fractures)   Vitamin B12 deficiency - Primary   Start vitamin B 12 1,012mcg daily. Check vitamin B12 levels today.        Relevant Orders   Vitamin B12   Abscess   A painful axillary swelling is identified as an abscess.  Prescribe doxycycline twice a day for ten days and advise warm compresses multiple times a day. Return if not improved by Monday or Tuesday for possible drainage.       Return in about 2 months (around 04/24/2024) for fatigue.    Gerre Scull, NP

## 2024-02-23 NOTE — Assessment & Plan Note (Signed)
 Swelling in ankles, feet, and hands is likely due to venous insufficiency. Recommend wearing compression socks during the day, elevating feet when sitting, and reducing salt intake. Check BNP today.

## 2024-02-23 NOTE — Assessment & Plan Note (Signed)
 She experiences persistent congestion, coughing, and nocturnal shortness of breath. An inhaler is prescribed for nighttime use as needed. Continue symptomatic treatment with over-the-counter medications.

## 2024-02-23 NOTE — Patient Instructions (Addendum)
 It was great to see you!  We are checking your labs today and will let you know the results via mychart/phone.   Keep taking your vitamin D once a week  Start B12 1,021mcg daily  Start doxycycline twice a day for 10 days with food  Do warm compresses frequently under your arms.   Wear compression socks during the day  Limit the amount of salt in your diet  I have placed a referral to sleep medicine, they will call to schedule   Let's follow-up in 2 months, sooner if you have concerns.  If a referral was placed today, you will be contacted for an appointment. Please note that routine referrals can sometimes take up to 3-4 weeks to process. Please call our office if you haven't heard anything after this time frame.  Take care,  Rodman Pickle, NP

## 2024-02-23 NOTE — Assessment & Plan Note (Signed)
 A painful axillary swelling is identified as an abscess.  Prescribe doxycycline twice a day for ten days and advise warm compresses multiple times a day. Return if not improved by Monday or Tuesday for possible drainage.

## 2024-02-23 NOTE — Assessment & Plan Note (Signed)
 Chronic fatigue with low energy levels is noted, with deficiencies in vitamin D and B12. Possible sleep apnea is suspected due to snoring. Vitamin supplementation benefits were discussed. Encourage adherence to weekly vitamin D supplementation and start vitamin B12 1000 mcg daily. Refer to Sleep Medicine for a home sleep study. Check vitamin B12, D, TSH, CMP, CBC today.

## 2024-02-23 NOTE — Assessment & Plan Note (Signed)
 Start vitamin B 12 1,084mcg daily. Check vitamin B12 levels today.

## 2024-02-26 ENCOUNTER — Encounter: Payer: Self-pay | Admitting: Nurse Practitioner

## 2024-02-26 ENCOUNTER — Telehealth: Payer: Self-pay

## 2024-02-26 DIAGNOSIS — R7989 Other specified abnormal findings of blood chemistry: Secondary | ICD-10-CM

## 2024-02-26 LAB — CBC WITH DIFFERENTIAL/PLATELET
Basophils Absolute: 0 10*3/uL (ref 0.0–0.2)
Basos: 0 %
EOS (ABSOLUTE): 0.1 10*3/uL (ref 0.0–0.4)
Eos: 1 %
Hematocrit: 37.2 % (ref 34.0–46.6)
Hemoglobin: 11.5 g/dL (ref 11.1–15.9)
Immature Grans (Abs): 0 10*3/uL (ref 0.0–0.1)
Immature Granulocytes: 0 %
Lymphocytes Absolute: 3.1 10*3/uL (ref 0.7–3.1)
Lymphs: 34 %
MCH: 27.6 pg (ref 26.6–33.0)
MCHC: 30.9 g/dL — ABNORMAL LOW (ref 31.5–35.7)
MCV: 89 fL (ref 79–97)
Monocytes Absolute: 0.4 10*3/uL (ref 0.1–0.9)
Monocytes: 5 %
Neutrophils Absolute: 5.6 10*3/uL (ref 1.4–7.0)
Neutrophils: 60 %
Platelets: 249 10*3/uL (ref 150–450)
RBC: 4.16 x10E6/uL (ref 3.77–5.28)
RDW: 11.5 % — ABNORMAL LOW (ref 11.7–15.4)
WBC: 9.3 10*3/uL (ref 3.4–10.8)

## 2024-02-26 LAB — COMPREHENSIVE METABOLIC PANEL
ALT: 21 IU/L (ref 0–32)
AST: 20 IU/L (ref 0–40)
Albumin: 4.4 g/dL (ref 4.0–5.0)
Alkaline Phosphatase: 102 IU/L (ref 44–121)
BUN/Creatinine Ratio: 13 (ref 9–23)
BUN: 11 mg/dL (ref 6–20)
Bilirubin Total: 0.3 mg/dL (ref 0.0–1.2)
CO2: 21 mmol/L (ref 20–29)
Calcium: 9 mg/dL (ref 8.7–10.2)
Chloride: 100 mmol/L (ref 96–106)
Creatinine, Ser: 0.83 mg/dL (ref 0.57–1.00)
Globulin, Total: 3 g/dL (ref 1.5–4.5)
Glucose: 142 mg/dL — ABNORMAL HIGH (ref 70–99)
Potassium: 3.6 mmol/L (ref 3.5–5.2)
Sodium: 141 mmol/L (ref 134–144)
Total Protein: 7.4 g/dL (ref 6.0–8.5)
eGFR: 98 mL/min/{1.73_m2} (ref >59)

## 2024-02-26 LAB — FSH/LH
FSH: 5 m[IU]/mL
LH: 4.5 m[IU]/mL

## 2024-02-26 LAB — VITAMIN B12: Vitamin B-12: 257 pg/mL (ref 232–1245)

## 2024-02-26 LAB — TSH: TSH: 4.82 u[IU]/mL — ABNORMAL HIGH (ref 0.450–4.500)

## 2024-02-26 LAB — ANA W/REFLEX

## 2024-02-26 LAB — VITAMIN D 25 HYDROXY (VIT D DEFICIENCY, FRACTURES): Vit D, 25-Hydroxy: 5.8 ng/mL — ABNORMAL LOW (ref 30.0–100.0)

## 2024-02-26 LAB — BRAIN NATRIURETIC PEPTIDE: BNP: 17.9 pg/mL (ref 0.0–100.0)

## 2024-02-26 LAB — TESTOSTERONE: Testosterone: 16 ng/dL (ref 13–71)

## 2024-02-26 NOTE — Telephone Encounter (Signed)
 Received fax from [facility] that a prior Berkley Harvey is needed on Wegovy 0.25mg . Patient ID # is [id number]

## 2024-02-27 ENCOUNTER — Other Ambulatory Visit (HOSPITAL_COMMUNITY): Payer: Self-pay

## 2024-02-27 ENCOUNTER — Telehealth: Payer: Self-pay

## 2024-02-27 NOTE — Telephone Encounter (Signed)
 Pharmacy Patient Advocate Encounter   Received notification from Pt Calls Messages that prior authorization for Wegovy 0.25mg /0.63ml is required/requested.   Insurance verification completed.   The patient is insured through  [employer]  .   Per test claim: PA required; PA submitted to above mentioned insurance via Fax Key/confirmation #/EOC -- Status is pending   Faxed Prior authorization initiation form to [phone]  Phone# [phone] (option 2)

## 2024-02-28 ENCOUNTER — Ambulatory Visit: Admitting: Nurse Practitioner

## 2024-03-01 ENCOUNTER — Other Ambulatory Visit (HOSPITAL_COMMUNITY): Payer: Self-pay

## 2024-03-04 ENCOUNTER — Encounter: Payer: Self-pay | Admitting: Nurse Practitioner

## 2024-03-04 ENCOUNTER — Other Ambulatory Visit (INDEPENDENT_AMBULATORY_CARE_PROVIDER_SITE_OTHER)

## 2024-03-04 DIAGNOSIS — R7989 Other specified abnormal findings of blood chemistry: Secondary | ICD-10-CM | POA: Diagnosis not present

## 2024-03-04 DIAGNOSIS — R768 Other specified abnormal immunological findings in serum: Secondary | ICD-10-CM

## 2024-03-04 DIAGNOSIS — R946 Abnormal results of thyroid function studies: Secondary | ICD-10-CM | POA: Diagnosis not present

## 2024-03-04 DIAGNOSIS — R5382 Chronic fatigue, unspecified: Secondary | ICD-10-CM

## 2024-03-04 LAB — T4, FREE: Free T4: 0.64 ng/dL (ref 0.60–1.60)

## 2024-03-04 LAB — TSH: TSH: 4.38 u[IU]/mL (ref 0.35–5.50)

## 2024-03-04 LAB — T3, FREE: T3, Free: 4.1 pg/mL (ref 2.3–4.2)

## 2024-03-05 ENCOUNTER — Other Ambulatory Visit (HOSPITAL_COMMUNITY): Payer: Self-pay

## 2024-03-05 ENCOUNTER — Telehealth: Payer: Self-pay

## 2024-03-05 LAB — THYROID PEROXIDASE ANTIBODIES (TPO) (REFL): Thyroperoxidase Ab SerPl-aCnc: 18 [IU]/mL — ABNORMAL HIGH (ref <9)

## 2024-03-05 NOTE — Telephone Encounter (Signed)
 Pharmacy Patient Advocate Encounter  Received notification from  [facility]  that Prior Authorization for [facility] 0.25mg /0.11ml has been DENIED.  It is a plan exclusion and is not covered.

## 2024-03-05 NOTE — Telephone Encounter (Signed)
 Pharmacy Patient Advocate Encounter   Received notification from Pt Calls Messages that prior authorization for Wegovy 0.25mg /0.46ml is required/requested.   Insurance verification completed.   The patient is insured through [facility] .   Per test claim: PA required; PA submitted to above mentioned insurance via Fax Key/confirmation #/EOC -- Status is pending   Phone# [phone] Fax# [phone]

## 2024-03-05 NOTE — Telephone Encounter (Signed)
 I called and spoke with patient and notified her of below message and she would like to be referred to [facility].

## 2024-03-06 NOTE — Telephone Encounter (Signed)
 Referral placed.

## 2024-03-06 NOTE — Telephone Encounter (Signed)
 Patient aware that referral made.

## 2024-03-07 ENCOUNTER — Other Ambulatory Visit (HOSPITAL_COMMUNITY): Payer: Self-pay

## 2024-03-12 NOTE — Telephone Encounter (Signed)
 Per previous telephone encounter provider referred patient to weight management.

## 2024-03-12 NOTE — Telephone Encounter (Signed)
 Placed a call to [facility] at [phone] to check on the status of the prior authorization.   Per the representative, the pharmacy will need to try to process the claim with the reject from her primary insurance then bill [employer] secondary to get the needs prior authorization reject so we can submit a new prior authorization.

## 2024-03-13 ENCOUNTER — Encounter (INDEPENDENT_AMBULATORY_CARE_PROVIDER_SITE_OTHER): Payer: Self-pay

## 2024-03-16 ENCOUNTER — Other Ambulatory Visit: Payer: Self-pay | Admitting: Nurse Practitioner

## 2024-03-18 NOTE — Telephone Encounter (Signed)
 Requesting: ALBUTEROL HFA INH (200 PUFFS) 6.7GM  Last Visit: 02/23/2024 Next Visit: 04/24/2024 Last Refill: 02/23/2024  Please Advise

## 2024-03-25 ENCOUNTER — Institutional Professional Consult (permissible substitution): Admitting: Neurology

## 2024-04-09 ENCOUNTER — Ambulatory Visit (INDEPENDENT_AMBULATORY_CARE_PROVIDER_SITE_OTHER): Admitting: Neurology

## 2024-04-09 ENCOUNTER — Encounter: Payer: Self-pay | Admitting: Neurology

## 2024-04-09 VITALS — BP 129/78 | HR 89 | Ht 69.0 in | Wt 282.0 lb

## 2024-04-09 DIAGNOSIS — Z9189 Other specified personal risk factors, not elsewhere classified: Secondary | ICD-10-CM

## 2024-04-09 DIAGNOSIS — R635 Abnormal weight gain: Secondary | ICD-10-CM

## 2024-04-09 DIAGNOSIS — Z820 Family history of epilepsy and other diseases of the nervous system: Secondary | ICD-10-CM

## 2024-04-09 DIAGNOSIS — R519 Headache, unspecified: Secondary | ICD-10-CM

## 2024-04-09 DIAGNOSIS — G4719 Other hypersomnia: Secondary | ICD-10-CM | POA: Diagnosis not present

## 2024-04-09 DIAGNOSIS — R0689 Other abnormalities of breathing: Secondary | ICD-10-CM

## 2024-04-09 DIAGNOSIS — R0683 Snoring: Secondary | ICD-10-CM

## 2024-04-09 NOTE — Progress Notes (Signed)
 Subjective:    Patient ID: Stacie Simpson is a 28 y.o. female.  HPI    [name], MD, PhD [facility] [address], [address] [address] [place], [place] [address]  Dear Barbra Ley,   I saw your patient, Stacie Simpson, upon your kind request in my sleep clinic today for initial consultation of her sleep disorder, in particular, concern for underlying obstructive sleep apnea.  The patient is unaccompanied today.  As you know, [name] is a 28 year old female with an underlying medical history of vitamin D  deficiency, vitamin B12 deficiency, anemia, reflux disease, anxiety, depression, TMJ dysfunction, and severe obesity with a BMI of over 40, who reports snoring and excessive daytime somnolence, as well as waking up with a sense of gasping for air.  Sleep-related symptoms have been ongoing for the past at least 6 months.  She has had weight gain over time, currently working on weight loss but has had difficulty losing weight.  Her Epworth sleepiness score is 16 out of 24, fatigue severity score is 59 out of 63.  Her maternal grandfather had sleep apnea.  She lives with her family including spouse and 3 children, ages 25, 69, and 59 years old.  She goes to bed generally between 930 and 10 PM and rise time is between 6 and 6:30 AM, lately in the past few weeks she has had trouble maintaining sleep and may wake up somewhere between 2 and 4 AM and has difficulty going back to sleep.  She does not take any sleep aids.  She denies nightly nocturia but has had occasional morning headaches.  She does sleep with the TV on at night, most nights, especially when her husband is out of town, he is a Naval architect and comes home twice a week.  She is full-time in school for cosmetology license.  She is supposed to see an endocrinologist.  She drinks caffeine in the form of soda, 2 to 3 cans of regular soda per day.  She is a non-smoker and does not drink any alcohol, tries to hydrate well with  water.  Her Past Medical History Is Significant For: Past Medical History:  Diagnosis Date   Anemia    Anxiety    COVID    x 2 mild cases   Depression    GERD (gastroesophageal reflux disease)    HSV-2 infection    Obesity    Pregnancy induced hypertension    1st pregnancy only   TMJ (temporomandibular joint disorder)    Vision abnormalities    Pt wears contacts    Her Past Surgical History Is Significant For: Past Surgical History:  Procedure Laterality Date   DILATION AND EVACUATION N/A 08/19/2022   Procedure: DILATATION AND EVACUATION ULTRA SOUND GUIDED;  Surgeon: Renea Carrion, MD;  Location: [facility] OR;  Service: Gynecology;  Laterality: N/A;   OPERATIVE ULTRASOUND N/A 08/19/2022   Procedure: OPERATIVE ULTRASOUND;  Surgeon: Renea Carrion, MD;  Location: [facility] OR;  Service: Gynecology;  Laterality: N/A;   PILONIDAL CYST / SINUS EXCISION     PILONIDAL CYST EXCISION     WISDOM TOOTH EXTRACTION  2013    Her Family History Is Significant For: Family History  Problem Relation Age of Onset   Healthy Mother    Hypertension Father    Obesity Father    Hypertension Maternal Grandmother    Hypertension Maternal Grandfather    Diabetes Maternal Grandfather    Heart disease Maternal Grandfather    Hypertension  Paternal Grandmother    Hypertension Paternal Grandfather    Cancer Paternal Grandfather        tongue   Heart disease Paternal Grandfather     Her Social History Is Significant For: Social History   Socioeconomic History   Marital status: Married    Spouse name: Not on file   Number of children: 3   Years of education: Not on file   Highest education level: Not on file  Occupational History   Occupation: Consulting civil engineer    Comment: 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    Comment: mirena 08/31/22  Other Topics Concern   Not on file  Social History Narrative   Not on file   Social Drivers of Health   Financial Resource Strain: Not on file  Food Insecurity: Not on file  Transportation Needs: Not on file  Physical Activity: Not on file  Stress: Not on file  Social Connections: Unknown (04/25/2022)   Received from [facility]   Social Network    Social Network: Not on file    Her Allergies Are:  No Known Allergies:   Her Current Medications Are:  Outpatient Encounter Medications as of 04/09/2024  Medication Sig   acetaminophen  (TYLENOL ) 325 MG tablet Take 2 tablets (650 mg total) by mouth every 4 (four) hours as needed (for pain scale < 4).   albuterol  (VENTOLIN  HFA) 108 (90 Base) MCG/ACT inhaler INHALE 2 PUFFS INTO THE LUNGS EVERY 6 HOURS AS NEEDED FOR WHEEZING OR SHORTNESS OF BREATH   ibuprofen  (ADVIL ) 600 MG tablet Take 1 tablet (600 mg total) by mouth every 6 (six) hours.   ketoconazole  (NIZORAL ) 2 % shampoo Apply 1 Application topically 2 (two) times a week.   loratadine  (CLARITIN ) 10 MG tablet Take 10 mg by mouth daily as needed for allergies.   triamcinolone  (KENALOG ) 0.025 % ointment Apply 1 Application topically 2 (two) times daily.   Vitamin D , Ergocalciferol , (DRISDOL ) 1.25 MG (50000 UNIT) CAPS capsule Take 1 capsule (50,000 Units total) by mouth every 7 (seven) days.   doxycycline  (VIBRA -TABS) 100 MG tablet Take 1 tablet (100 mg total) by mouth 2 (two) times daily.   [DISCONTINUED] fluticasone (FLONASE) 50 MCG/ACT nasal spray Place 1-2 sprays into both nostrils daily as needed for allergies or rhinitis.   No facility-administered encounter medications on file as of 04/09/2024.  :   Review of Systems:  Out of a complete 14 point review of systems, all are reviewed and negative with the exception of these symptoms as listed  below:  Review of Systems  Neurological:        Room 5 Pt is here Alone. Pt states that she can't sleep at night. Pt states that she will have a hard time breathing at night while sleeping. Pt states that she has vivid dreams. Pt states that she snore loud.  ESS 16  FSS 59    Objective:  Neurological Exam  Physical Exam Physical Examination:   Vitals:   04/09/24 1533  BP: 129/78  Pulse: 89    General Examination: The patient is a very pleasant 28 y.o. female in no acute distress.  She appears well-developed and well-nourished and well groomed.   HEENT: Normocephalic, atraumatic, pupils are equal, round and reactive to light, extraocular tracking is good without limitation to gaze excursion or nystagmus noted. Hearing is grossly intact. Face is symmetric with normal facial animation. Speech is clear with no dysarthria noted. There is no hypophonia. There is no lip, neck/head, jaw or voice tremor. Neck is supple with full range of passive and active motion. There are no carotid bruits on auscultation. Oropharynx exam reveals: mild mouth dryness, good dental hygiene and moderate airway crowding, due to small airway entry, tonsillar size of 1-2+ on the right and 1+ on the left, slightly wider tongue noted.  Mallampati class III.  Neck circumference 16-7/8 inches, minimal to mild overbite noted.  Chest: Clear to auscultation without wheezing, rhonchi or crackles noted.  Heart: S1+S2+0, regular and normal without murmurs, rubs or gallops noted.   Abdomen: Soft, non-tender and non-distended.  Extremities: There is no pitting edema in the distal lower extremities bilaterally.   Skin: Warm and dry without trophic changes noted.   Musculoskeletal: exam reveals no obvious joint deformities.   Neurologically:  Mental status: The patient is awake, alert and oriented in all 4 spheres. Her immediate and remote memory, attention, language skills and fund of knowledge are appropriate. There is no  evidence of aphasia, agnosia, apraxia or anomia. Speech is clear with normal prosody and enunciation. Thought process is linear. Mood is normal and affect is normal.  Cranial nerves II - XII are as described above under HEENT exam.  Motor exam: Normal bulk, strength and tone is noted. There is no obvious action or resting tremor.  Fine motor skills and coordination: grossly intact.  Cerebellar testing: No dysmetria or intention tremor. There is no truncal or gait ataxia.  Sensory exam: intact to light touch in the upper and lower extremities.  Gait, station and balance: She stands easily. No veering to one side is noted. No leaning to one side is noted. Posture is age-appropriate and stance is narrow based. Gait shows normal stride length and normal pace. No problems turning are noted.   Assessment and plan:  In summary, ALICJA HALPERN is a very pleasant 28 y.o.-year old female with an underlying medical history of vitamin D  deficiency, vitamin B12 deficiency, anemia, reflux disease, anxiety, depression, TMJ dysfunction, and severe obesity with a BMI of over 40, whose history and physical exam are concerning for sleep disordered breathing, particularly obstructive sleep apnea (OSA). A laboratory attended sleep study is typically considered "gold standard" for evaluation of sleep disordered breathing.   I had a long chat with the patient about my findings and the diagnosis of sleep apnea, particularly OSA, its prognosis and treatment options. We talked about medical/conservative treatments, surgical interventions and non-pharmacological approaches for symptom control. I explained, in particular, the risks and ramifications of untreated moderate to severe OSA, especially with respect to developing cardiovascular disease down the road, including congestive heart failure (CHF), difficult to treat hypertension, cardiac arrhythmias (particularly A-fib), neurovascular complications including TIA, stroke and  dementia. Even type 2 diabetes has, in part, been linked to untreated OSA. Symptoms of untreated OSA may include (but may not be limited to) daytime sleepiness, nocturia (i.e. frequent nighttime urination), memory problems, mood irritability and suboptimally controlled or worsening mood disorder such as depression and/or anxiety, lack of energy, lack of motivation, physical discomfort, as well as recurrent headaches, especially morning or nocturnal headaches. We talked about the importance of maintaining a healthy  lifestyle and striving for healthy weight. In addition, we talked about the importance of striving for and maintaining good sleep hygiene. I recommended a sleep study at this time. I outlined the differences between a laboratory attended sleep study which is considered more comprehensive and accurate over the option of a home sleep test (HST); the latter may lead to underestimation of sleep disordered breathing in some instances and does not help with diagnosing upper airway resistance syndrome and is not accurate enough to diagnose primary central sleep apnea typically. I outlined possible surgical and non-surgical treatment options of OSA, including the use of a positive airway pressure (PAP) device (i.e. CPAP, AutoPAP/APAP or BiPAP in certain circumstances), a custom-made dental device (aka oral appliance, which would require a referral to a specialist dentist or orthodontist typically, and is generally speaking not considered for patients with full dentures or edentulous state), upper airway surgical options, such as traditional UPPP (which is not considered a first-line treatment) or the Inspire device (hypoglossal nerve stimulator, which would involve a referral for consultation with an ENT surgeon, after careful selection, following inclusion criteria - also not first-line treatment). I explained the PAP treatment option to the patient in detail, as this is generally considered first-line  treatment.  The patient indicated that she would be willing to try PAP therapy, if the need arises. I explained the importance of being compliant with PAP treatment, not only for insurance purposes but primarily to improve patient's symptoms symptoms, and for the patient's long term health benefit, including to reduce Her cardiovascular risks longer-term.    We will pick up our discussion about the next steps and treatment options after testing.  We will keep her posted as to the test results by phone call and/or MyChart messaging where possible.  We will plan to follow-up in sleep clinic accordingly as well.  I answered all her questions today and the patient was in agreement.   I encouraged her to call with any interim questions, concerns, problems or updates or email us  through MyChart.  Generally speaking, sleep test authorizations may take up to 2 weeks, sometimes less, sometimes longer, the patient is encouraged to get in touch with us  if they do not hear back from the sleep lab staff directly within the next 2 weeks.  Thank you very much for allowing me to participate in the care of this nice patient. If I can be of any further assistance to you please do not hesitate to call me at [phone].  Sincerely,   [name], MD, PhD

## 2024-04-09 NOTE — Patient Instructions (Signed)

## 2024-04-17 ENCOUNTER — Other Ambulatory Visit: Payer: Self-pay | Admitting: Medical Genetics

## 2024-04-17 ENCOUNTER — Encounter (HOSPITAL_COMMUNITY): Payer: Self-pay

## 2024-04-18 ENCOUNTER — Other Ambulatory Visit (HOSPITAL_COMMUNITY)

## 2024-04-19 ENCOUNTER — Telehealth: Payer: Self-pay | Admitting: Neurology

## 2024-04-19 NOTE — Telephone Encounter (Signed)
 NPSG & HST no auth req for either codes ref E Jamie L on 05-06-2024.   Sent mychart.

## 2024-04-24 ENCOUNTER — Ambulatory Visit: Admitting: Nurse Practitioner

## 2024-04-29 ENCOUNTER — Ambulatory Visit (INDEPENDENT_AMBULATORY_CARE_PROVIDER_SITE_OTHER): Admitting: Nurse Practitioner

## 2024-04-29 ENCOUNTER — Encounter: Payer: Self-pay | Admitting: Nurse Practitioner

## 2024-04-29 VITALS — BP 128/82 | HR 100 | Temp 97.3°F | Ht 69.0 in | Wt 276.2 lb

## 2024-04-29 DIAGNOSIS — J4530 Mild persistent asthma, uncomplicated: Secondary | ICD-10-CM | POA: Insufficient documentation

## 2024-04-29 DIAGNOSIS — E538 Deficiency of other specified B group vitamins: Secondary | ICD-10-CM | POA: Diagnosis not present

## 2024-04-29 DIAGNOSIS — E559 Vitamin D deficiency, unspecified: Secondary | ICD-10-CM

## 2024-04-29 DIAGNOSIS — R7989 Other specified abnormal findings of blood chemistry: Secondary | ICD-10-CM | POA: Insufficient documentation

## 2024-04-29 MED ORDER — PHENTERMINE HCL 15 MG PO CAPS: 30.0000 mg | ORAL_CAPSULE | ORAL | 0 refills | Status: DC

## 2024-04-29 MED ORDER — QVAR REDIHALER 40 MCG/ACT IN AERB: 2.0000 | INHALATION_SPRAY | Freq: Two times a day (BID) | RESPIRATORY_TRACT | 2 refills | Status: AC

## 2024-04-29 NOTE — Patient Instructions (Addendum)
 It was great to see you!  Start phentermine  once a day in the morning (2 capsules)   Stop the phentermine  1 week before your sleep study   Start qvar  inhaler 2 puffs twice a day, rinse your mouth after using   Keep using the albuterol  as needed   We are checking your labs today and will let you know the results via mychart/phone.   Let's follow-up in 3 months, sooner if you have concerns.  If a referral was placed today, you will be contacted for an appointment. Please note that routine referrals can sometimes take up to 3-4 weeks to process. Please call our office if you haven't heard anything after this time frame.  Take care,  Rheba Cedar, NP

## 2024-04-29 NOTE — Assessment & Plan Note (Signed)
 Nocturnal asthma exacerbations are managed with albuterol , but additional control is needed, especially during pollen season. Prescribe Qvar  inhaler, two puffs twice daily, and instruct her to rinse her mouth after use to prevent thrush. Continue albuterol  inhaler as needed. Reassess inhaler use in June or July, with potential discontinuation if symptoms improve.

## 2024-04-29 NOTE — Assessment & Plan Note (Signed)
 She is obese but has experienced recent weight loss. Phentermine  was previously effective but caused insomnia. It should be stopped before the sleep study. Prescribe phentermine , 15 mg capsules, two capsules in the morning before eating. Instruct her to stop phentermine  one week before the sleep study on June 16th or 17th. Continue focus on nutrition and exercise.

## 2024-04-29 NOTE — Assessment & Plan Note (Signed)
 She has been taking vitamin D  regularly. Check vitamin D  levels and adjust regimen based on results.

## 2024-04-29 NOTE — Assessment & Plan Note (Signed)
 She is taking vitamin B 12 1,000mcg daily. Check vitamin B12 levels today.

## 2024-04-29 NOTE — Assessment & Plan Note (Signed)
 Last TSH was normal. Order TSH and free T4 tests to assess thyroid  function.

## 2024-04-29 NOTE — Progress Notes (Signed)
 Established Patient Office Visit  Subjective   Patient ID: Stacie Simpson, female    DOB: 01-30-1996  Age: 28 y.o. MRN: [id number]  Chief Complaint  Patient presents with   Fatigue    Follow up, waking up and gasping for air and coughing    HPI Discussed the use of AI scribe software for clinical note transcription with the patient, who gave verbal consent to proceed.  History of Present Illness   Stacie Simpson is a 28 year old female with asthma who presents with nocturnal shortness of breath and fatigue.  She experiences nocturnal shortness of breath and coughing two to three times per week, often beginning during dreams and continuing upon waking. She uses her albuterol  inhaler, taking two puffs initially and sometimes an additional two puffs if needed, which provides some relief.  She is concerned about facial rounding and requests cortisone level evaluation. She also wants her thyroid  levels checked due to previous thyroid  issues.  She takes vitamin D  supplements weekly and B12. She has lost six pounds recently, attributing this to phentermine  use, which she takes in the morning to avoid sleep disturbances.  During the day, she does not experience shortness of breath or coughing but has allergy symptoms like a scratchy throat and runny nose, managed with Claritin . She denies chest pain and wheezing.  She is scheduled for a sleep study on June 16th or 17th to evaluate her nocturnal symptoms and fatigue further.        ROS See pertinent positives and negatives per HPI.    Objective:     BP 128/82 (BP Location: Right Arm, Patient Position: Sitting, Cuff Size: Normal)   Pulse 100   Temp (!) 97.3 F (36.3 C)   Ht 5\' 9"  (1.753 m)   Wt 276 lb 3.2 oz (125.3 kg)   SpO2 98%   Breastfeeding No   BMI 40.79 kg/m  BP Readings from Last 3 Encounters:  04/29/24 128/82  04/09/24 129/78  02/23/24 128/80   Wt Readings from Last 3 Encounters:  04/29/24 276 lb 3.2 oz (125.3 kg)   04/09/24 282 lb (127.9 kg)  02/23/24 279 lb 9.6 oz (126.8 kg)      Physical Exam Vitals and nursing note reviewed.  Constitutional:      General: She is not in acute distress.    Appearance: Normal appearance.  HENT:     Head: Normocephalic.  Eyes:     Conjunctiva/sclera: Conjunctivae normal.  Cardiovascular:     Rate and Rhythm: Normal rate and regular rhythm.     Pulses: Normal pulses.     Heart sounds: Normal heart sounds.  Pulmonary:     Effort: Pulmonary effort is normal.     Breath sounds: Normal breath sounds.  Musculoskeletal:     Cervical back: Normal range of motion.  Skin:    General: Skin is warm.  Neurological:     General: No focal deficit present.     Mental Status: She is alert and oriented to person, place, and time.  Psychiatric:        Mood and Affect: Mood normal.        Behavior: Behavior normal.        Thought Content: Thought content normal.        Judgment: Judgment normal.      Assessment & Plan:   Problem List Items Addressed This Visit       Respiratory   Mild persistent asthma without complication - Primary  Nocturnal asthma exacerbations are managed with albuterol , but additional control is needed, especially during pollen season. Prescribe Qvar  inhaler, two puffs twice daily, and instruct her to rinse her mouth after use to prevent thrush. Continue albuterol  inhaler as needed. Reassess inhaler use in June or July, with potential discontinuation if symptoms improve.      Relevant Medications   beclomethasone (QVAR  REDIHALER) 40 MCG/ACT inhaler     Other   Morbid obesity (HCC)   She is obese but has experienced recent weight loss. Phentermine  was previously effective but caused insomnia. It should be stopped before the sleep study. Prescribe phentermine , 15 mg capsules, two capsules in the morning before eating. Instruct her to stop phentermine  one week before the sleep study on June 16th or 17th. Continue focus on nutrition and  exercise.       Relevant Medications   phentermine  15 MG capsule   Other Relevant Orders   VITAMIN D  25 Hydroxy (Vit-D Deficiency, Fractures)   Vitamin B12   Cortisol   Vitamin D  deficiency   She has been taking vitamin D  regularly. Check vitamin D  levels and adjust regimen based on results.       Relevant Orders   VITAMIN D  25 Hydroxy (Vit-D Deficiency, Fractures)   Vitamin B12 deficiency   She is taking vitamin B 12 1,000mcg daily. Check vitamin B12 levels today.       Relevant Orders   Vitamin B12   Elevated TSH   Last TSH was normal. Order TSH and free T4 tests to assess thyroid  function.      Relevant Orders   TSH   T4, free   Return in about 3 months (around 07/30/2024) for weight, fatigue .    Odette Benjamin, NP

## 2024-04-30 LAB — CORTISOL: Cortisol, Plasma: 5.6 ug/dL

## 2024-04-30 LAB — T4, FREE: Free T4: 0.71 ng/dL (ref 0.60–1.60)

## 2024-04-30 LAB — VITAMIN B12: Vitamin B-12: 128 pg/mL — ABNORMAL LOW (ref 211–911)

## 2024-04-30 LAB — TSH: TSH: 3.04 u[IU]/mL (ref 0.35–5.50)

## 2024-04-30 LAB — VITAMIN D 25 HYDROXY (VIT D DEFICIENCY, FRACTURES): VITD: 13.31 ng/mL — ABNORMAL LOW (ref 30.00–100.00)

## 2024-05-01 ENCOUNTER — Ambulatory Visit: Payer: Self-pay | Admitting: Nurse Practitioner

## 2024-05-02 ENCOUNTER — Ambulatory Visit (INDEPENDENT_AMBULATORY_CARE_PROVIDER_SITE_OTHER)

## 2024-05-02 DIAGNOSIS — E538 Deficiency of other specified B group vitamins: Secondary | ICD-10-CM | POA: Diagnosis not present

## 2024-05-02 MED ORDER — CYANOCOBALAMIN 1000 MCG/ML IJ SOLN
1000.0000 ug | Freq: Once | INTRAMUSCULAR | Status: AC
  Administered 2024-05-02: 1000 ug via INTRAMUSCULAR

## 2024-05-02 NOTE — Progress Notes (Signed)
 Per orders of McElwee, Lauren A, NP, injection of B12 given in right  deltoid by Clifford Coudriet D Lamont Tant. Patient tolerated injection well.  Lab Results  Component Value Date   VITAMINB12 128 (L) 04/29/2024

## 2024-05-09 ENCOUNTER — Ambulatory Visit (INDEPENDENT_AMBULATORY_CARE_PROVIDER_SITE_OTHER)

## 2024-05-09 DIAGNOSIS — E538 Deficiency of other specified B group vitamins: Secondary | ICD-10-CM

## 2024-05-09 MED ORDER — CYANOCOBALAMIN 1000 MCG/ML IJ SOLN
1000.0000 ug | Freq: Once | INTRAMUSCULAR | Status: AC
Start: 2024-05-09 — End: 2024-05-09
  Administered 2024-05-09: 1000 ug via INTRAMUSCULAR

## 2024-05-09 NOTE — Progress Notes (Signed)
 Patient is in office today for a nurse visit  B12 Injection. Patient Injection was given in the  Right deltoid. Patient tolerated injection well.  B12 1000 mcg/ml

## 2024-05-15 ENCOUNTER — Telehealth: Payer: Self-pay

## 2024-05-15 NOTE — Telephone Encounter (Signed)
 Copied from CRM [id number]. Topic: General - Other >> May 15, 2024  3:21 PM Stacie Simpson wrote: Reason for CRM: PT advise per PCP order, she is to keep both 06/05, 06/12 appt then her injections will get done monthly.

## 2024-05-15 NOTE — Telephone Encounter (Signed)
 Patient returned call and she wanted to verify days of B12 injections. I told patient and she was ok.

## 2024-05-15 NOTE — Telephone Encounter (Signed)
Left message for patient to return.

## 2024-05-16 ENCOUNTER — Ambulatory Visit (INDEPENDENT_AMBULATORY_CARE_PROVIDER_SITE_OTHER)

## 2024-05-16 DIAGNOSIS — E538 Deficiency of other specified B group vitamins: Secondary | ICD-10-CM | POA: Diagnosis not present

## 2024-05-16 MED ORDER — CYANOCOBALAMIN 1000 MCG/ML IJ SOLN
1000.0000 ug | Freq: Once | INTRAMUSCULAR | Status: AC
  Administered 2024-05-16: 1000 ug via INTRAMUSCULAR

## 2024-05-16 NOTE — Progress Notes (Signed)
 Pt here for monthly B12 injection per   B12 1000mcg given Left IM and pt tolerated injection well.  Next B12 injection scheduled for 1 week  Laroy Plunk, CMA

## 2024-05-23 ENCOUNTER — Ambulatory Visit: Admitting: Nurse Practitioner

## 2024-05-23 ENCOUNTER — Ambulatory Visit

## 2024-05-23 ENCOUNTER — Encounter: Payer: Self-pay | Admitting: Nurse Practitioner

## 2024-05-23 VITALS — BP 122/80 | HR 84 | Temp 97.5°F | Ht 69.0 in | Wt 267.6 lb

## 2024-05-23 DIAGNOSIS — E538 Deficiency of other specified B group vitamins: Secondary | ICD-10-CM

## 2024-05-23 DIAGNOSIS — L0291 Cutaneous abscess, unspecified: Secondary | ICD-10-CM

## 2024-05-23 MED ORDER — VITAMIN D (ERGOCALCIFEROL) 1.25 MG (50000 UNIT) PO CAPS: 50000.0000 [IU] | ORAL_CAPSULE | ORAL | 1 refills | Status: DC

## 2024-05-23 MED ORDER — LIDOCAINE-EPINEPHRINE 1 %-1:100000 IJ SOLN
0.5000 mL | Freq: Once | INTRAMUSCULAR | Status: AC
  Administered 2024-05-23: 0.5 mL via INTRADERMAL

## 2024-05-23 MED ORDER — CYANOCOBALAMIN 1000 MCG/ML IJ SOLN
1000.0000 ug | Freq: Once | INTRAMUSCULAR | Status: AC
  Administered 2024-05-23: 1000 ug via INTRAMUSCULAR

## 2024-05-23 NOTE — Assessment & Plan Note (Signed)
 She is receiving Vitamin B12 injections and transitioning to a monthly schedule. Administer the Vitamin B12 injection and transition to monthly injections

## 2024-05-23 NOTE — Assessment & Plan Note (Signed)
 The groin abscess is worsening with drainage and tenderness, but no fever is present. Will do I&D today. See note below.

## 2024-05-23 NOTE — Progress Notes (Signed)
 Acute Office Visit  Subjective:     Patient ID: Stacie Simpson, female    DOB: 24-Mar-1996, 28 y.o.   MRN: [id number]  Chief Complaint  Patient presents with   Abscess    On left buttock, request to get B12    HPI Discussed the use of AI scribe software for clinical note transcription with the patient, who gave verbal consent to proceed.  History of Present Illness   Stacie Simpson is a 28 year old female who presents with an abscess.  She noticed the abscess two days ago, initially mistaking it for a pimple. After applying a pimple patch, the abscess worsened and began to drain. She applied pressure, resulting in oozing. The area is extremely tender. She has not experienced any fevers. Tylenol  provided temporary pain relief last night, but the pain returned by morning.  She has experienced abscesses before, including one under her arm. She uses Hibiclens  regularly to prevent them.     ROS See pertinent positives and negatives per HPI.     Objective:    BP 122/80 (BP Location: Left Arm, Patient Position: Sitting, Cuff Size: Normal)   Pulse 84   Temp (!) 97.5 F (36.4 C)   Ht 5' 9 (1.753 m)   Wt 267 lb 9.6 oz (121.4 kg)   SpO2 98%   Breastfeeding No   BMI 39.52 kg/m  BP Readings from Last 3 Encounters:  05/23/24 122/80  04/29/24 128/82  04/09/24 129/78   Wt Readings from Last 3 Encounters:  05/23/24 267 lb 9.6 oz (121.4 kg)  04/29/24 276 lb 3.2 oz (125.3 kg)  04/09/24 282 lb (127.9 kg)      Physical Exam Vitals and nursing note reviewed.  Constitutional:      General: She is not in acute distress.    Appearance: Normal appearance.  HENT:     Head: Normocephalic.   Eyes:     Conjunctiva/sclera: Conjunctivae normal.   Pulmonary:     Effort: Pulmonary effort is normal.   Musculoskeletal:     Cervical back: Normal range of motion.   Skin:    General: Skin is warm.     Comments: Abscess with fluctuance to left buttock, small amount purulent drainage    Neurological:     General: No focal deficit present.     Mental Status: She is alert and oriented to person, place, and time.   Psychiatric:        Mood and Affect: Mood normal.        Behavior: Behavior normal.        Thought Content: Thought content normal.        Judgment: Judgment normal.       Assessment & Plan:   Problem List Items Addressed This Visit       Other   B12 deficiency   She is receiving Vitamin B12 injections and transitioning to a monthly schedule. Administer the Vitamin B12 injection and transition to monthly injections       Abscess - Primary   The groin abscess is worsening with drainage and tenderness, but no fever is present. Will do I&D today. See note below.       Skin Procedure  Procedure: {Incision & Drainage  Diagnosis:   ICD-10-CM   1. Abscess  L02.91 lidocaine -EPINEPHrine  (XYLOCAINE  W/EPI) 1 %-1:100000 (with pres) injection 0.5 mL    2. B12 deficiency  E53.8 cyanocobalamin  (VITAMIN B12) injection 1,000 mcg      Lesion Location/Size:  Left buttock, marble sized Provider: Rheba Cedar, NP Consent:  Risks, benefits, and alternative treatments discussed and all questions were answered.  Patient elected to proceed and verbal consent obtained.  Description: Area prepped and draped using semi-sterile technique. Area locally anesthetized using 0.5 cc's of lidocaine  1% with epi. Using a 15 blade scalpel, a 0.3 cm incision was made above the lesion. Cyst cavity entered and moderate amount of purulent material expressed.  Cyst wall was removed in pieces using mosquito hemostat.  Adequate hemostastis achieved using gauze and pressure. Complications: None Estimated Blood Loss: None Post Procedure Instructions: Wound care instructions discussed and patient was instructed to keep area clean and dry.  Signs and symptoms of infection discussed, patient agrees to contact the office ASAP should they occur.  Dressing change recommended BID.  Follow Up:  As needed  Meds ordered this encounter  Medications   cyanocobalamin  (VITAMIN B12) injection 1,000 mcg   Vitamin D , Ergocalciferol , (DRISDOL ) 1.25 MG (50000 UNIT) CAPS capsule    Sig: Take 1 capsule (50,000 Units total) by mouth every 7 (seven) days.    Dispense:  12 capsule    Refill:  1   lidocaine -EPINEPHrine  (XYLOCAINE  W/EPI) 1 %-1:100000 (with pres) injection 0.5 mL    Return if symptoms worsen or fail to improve.  Odette Benjamin, NP

## 2024-05-23 NOTE — Patient Instructions (Signed)
 It was great to see you!  Let me know if you develop any fevers, extra drainage, redness  You can take ibuprofen  or tylenol  for pain  Change the dressing twice a day and as needed  Let's follow-up if symptoms worsen or any concerns  Take care,  Rheba Cedar, NP

## 2024-05-28 ENCOUNTER — Encounter

## 2024-06-03 ENCOUNTER — Other Ambulatory Visit: Payer: Self-pay | Admitting: Nurse Practitioner

## 2024-06-03 NOTE — Telephone Encounter (Signed)
 Requesting: PHENTERMINE  HCL 15MG  CAPSULES  Last Visit: 05/23/2024 Next Visit: 07/31/2024 Last Refill: 04/29/2024  Please Advise

## 2024-06-17 NOTE — Progress Notes (Unsigned)
 Pt here for monthly B12 injection per Tinnie Harada. See 05/01/24 result note for orders.  B12 1000mcg given IM and pt tolerated injection well.   Next B12 injection scheduled for 07/18/24.

## 2024-06-20 ENCOUNTER — Ambulatory Visit

## 2024-06-20 DIAGNOSIS — E538 Deficiency of other specified B group vitamins: Secondary | ICD-10-CM

## 2024-06-20 MED ORDER — CYANOCOBALAMIN 1000 MCG/ML IJ SOLN
1000.0000 ug | Freq: Once | INTRAMUSCULAR | Status: AC
  Administered 2024-06-20: 1000 ug via INTRAMUSCULAR

## 2024-06-24 ENCOUNTER — Encounter

## 2024-07-18 ENCOUNTER — Ambulatory Visit

## 2024-07-19 ENCOUNTER — Other Ambulatory Visit: Payer: Self-pay | Admitting: Nurse Practitioner

## 2024-07-22 NOTE — Telephone Encounter (Signed)
 Requesting: PHENTERMINE HCL 15MG  CAPSULES  Last Visit: 05/23/2024 Next Visit: 07/31/2024 Last Refill: 06/03/2024  Please Advise

## 2024-07-31 ENCOUNTER — Encounter: Payer: Self-pay | Admitting: Nurse Practitioner

## 2024-07-31 ENCOUNTER — Ambulatory Visit: Admitting: Nurse Practitioner

## 2024-07-31 VITALS — BP 122/80 | HR 91 | Temp 97.5°F | Ht 69.0 in | Wt 255.0 lb

## 2024-07-31 DIAGNOSIS — K5900 Constipation, unspecified: Secondary | ICD-10-CM

## 2024-07-31 DIAGNOSIS — E538 Deficiency of other specified B group vitamins: Secondary | ICD-10-CM | POA: Diagnosis not present

## 2024-07-31 DIAGNOSIS — R5382 Chronic fatigue, unspecified: Secondary | ICD-10-CM | POA: Diagnosis not present

## 2024-07-31 DIAGNOSIS — E669 Obesity, unspecified: Secondary | ICD-10-CM | POA: Diagnosis not present

## 2024-07-31 DIAGNOSIS — R7989 Other specified abnormal findings of blood chemistry: Secondary | ICD-10-CM | POA: Diagnosis not present

## 2024-07-31 DIAGNOSIS — E559 Vitamin D deficiency, unspecified: Secondary | ICD-10-CM

## 2024-07-31 MED ORDER — CYANOCOBALAMIN 1000 MCG/ML IJ SOLN: 1000.0000 ug | INTRAMUSCULAR | 1 refills | Status: AC

## 2024-07-31 MED ORDER — CYANOCOBALAMIN 1000 MCG/ML IJ SOLN
1000.0000 ug | Freq: Once | INTRAMUSCULAR | Status: AC
  Administered 2024-07-31: 1000 ug via INTRAMUSCULAR

## 2024-07-31 NOTE — Patient Instructions (Signed)
 It was great to see you!  We are checking your labs today and will let you know the results via mychart/phone.   You can start docusate or miralax daily as needed for constipation   We will have you start giving your vitamin B12 once a month at home   Let's follow-up in 6 months, sooner if you have concerns.  If a referral was placed today, you will be contacted for an appointment. Please note that routine referrals can sometimes take up to 3-4 weeks to process. Please call our office if you haven't heard anything after this time frame.  Take care,  Tinnie Harada, NP

## 2024-07-31 NOTE — Assessment & Plan Note (Signed)
 Managed with monthly injections, but she missed the last injection due to work. She is open to self-administration at home. Administer the vitamin B12 injection today. Instruct on self-administration of vitamin B12 injections at home monthly and provide necessary supplies. Check B12 levels today.

## 2024-07-31 NOTE — Assessment & Plan Note (Signed)
 Fatigue is improving with weight loss and B12 injections. Check CMP, CBC today.

## 2024-07-31 NOTE — Progress Notes (Signed)
 Established Patient Office Visit  Subjective   Patient ID: Stacie Simpson, female    DOB: 10/09/1996  Age: 28 y.o. MRN: [id number]  Chief Complaint  Patient presents with   Weight Management and Fatigue    Follow up, discuss next steps in weight management    HPI Discussed the use of AI scribe software for clinical note transcription with the patient, who gave verbal consent to proceed.  History of Present Illness   Stacie Simpson is a 28 year old female who presents for weight management and vitamin B12 supplementation.  She is nearing her goal weight of 190 to 200 pounds and uses phentermine  for weight loss, cycling phentermine  every three months. Her regimen includes a high-protein, low-carbohydrate diet with smaller portions and skipping breakfast unless it is protein-rich. She does not experience excessive hunger if a dose is missed.  She experiences constipation and is wondering what she could take for this. She denies stomach pain, nausea, and vomiting.   She receives monthly vitamin B12 injections and missed her last dose due to work but plans to receive it today. She takes vitamin D  supplements but occasionally forgets them. Her vitamin D  levels have been low, and she has brittle nails.  She reports improved fatigue levels and feels well-rested upon waking, attributing this to weight loss and better management of sleep apnea.       ROS See pertinent positives and negatives per HPI.    Objective:     BP 122/80 (BP Location: Right Arm, Patient Position: Sitting, Cuff Size: Normal)   Pulse 91   Temp (!) 97.5 F (36.4 C)   Ht 5' 9 (1.753 m)   Wt 255 lb (115.7 kg)   LMP 07/26/2024 (Exact Date)   SpO2 98%   BMI 37.66 kg/m  BP Readings from Last 3 Encounters:  07/31/24 122/80  05/23/24 122/80  04/29/24 128/82   Wt Readings from Last 3 Encounters:  07/31/24 255 lb (115.7 kg)  05/23/24 267 lb 9.6 oz (121.4 kg)  04/29/24 276 lb 3.2 oz (125.3 kg)      Physical  Exam Vitals and nursing note reviewed.  Constitutional:      General: She is not in acute distress.    Appearance: Normal appearance.  HENT:     Head: Normocephalic.  Eyes:     Conjunctiva/sclera: Conjunctivae normal.  Cardiovascular:     Rate and Rhythm: Normal rate and regular rhythm.     Pulses: Normal pulses.     Heart sounds: Normal heart sounds.  Pulmonary:     Effort: Pulmonary effort is normal.     Breath sounds: Normal breath sounds.  Musculoskeletal:     Cervical back: Normal range of motion.  Skin:    General: Skin is warm.  Neurological:     General: No focal deficit present.     Mental Status: She is alert and oriented to person, place, and time.  Psychiatric:        Mood and Affect: Mood normal.        Behavior: Behavior normal.        Thought Content: Thought content normal.        Judgment: Judgment normal.      Assessment & Plan:   Problem List Items Addressed This Visit       Other   Obesity (BMI 30-39.9)   BMI 37.6. Obesity management focuses on dietary changes and portion control. She has been on phentermine  for 3 months and  needs to take a 3 month break. Reduced carbohydrate intake and increased protein consumption aid in portion control and satiety. Continue dietary modifications with a focus on protein and reduced carbohydrates. Encourage regular physical activity, including playing with children and walking. Consider resuming phentermine  after a three-month break if needed.      Fatigue   Fatigue is improving with weight loss and B12 injections. Check CMP, CBC today.       Relevant Orders   CBC with Differential/Platelet   Comprehensive metabolic panel with GFR   Vitamin D  deficiency   Managed with supplements, but she reports inconsistent adherence. Encourage consistent use of vitamin D  supplements and set reminders to improve adherence. Check vitamin D  today.       Relevant Orders   VITAMIN D  25 Hydroxy (Vit-D Deficiency, Fractures)    B12 deficiency - Primary   Managed with monthly injections, but she missed the last injection due to work. She is open to self-administration at home. Administer the vitamin B12 injection today. Instruct on self-administration of vitamin B12 injections at home monthly and provide necessary supplies. Check B12 levels today.      Relevant Medications   cyanocobalamin  (VITAMIN B12) 1000 MCG/ML injection   Other Relevant Orders   Vitamin B12   Elevated TSH   Monitored with previous labs showing elevated TPO antibodies but normal thyroid  function. Check TSH today.       Relevant Orders   TSH   Other Visit Diagnoses       Constipation, unspecified constipation type       Constipation is likely due to reduced food intake. Recommend docusate or Miralax daily as needed for relief. Ensure adequate fluid intake.      Return in about 6 months (around 01/31/2025) for CPE.    Tinnie DELENA Harada, NP

## 2024-07-31 NOTE — Assessment & Plan Note (Signed)
 Monitored with previous labs showing elevated TPO antibodies but normal thyroid  function. Check TSH today.

## 2024-07-31 NOTE — Assessment & Plan Note (Signed)
 BMI 37.6. Obesity management focuses on dietary changes and portion control. She has been on phentermine  for 3 months and needs to take a 3 month break. Reduced carbohydrate intake and increased protein consumption aid in portion control and satiety. Continue dietary modifications with a focus on protein and reduced carbohydrates. Encourage regular physical activity, including playing with children and walking. Consider resuming phentermine  after a three-month break if needed.

## 2024-07-31 NOTE — Assessment & Plan Note (Signed)
 Managed with supplements, but she reports inconsistent adherence. Encourage consistent use of vitamin D  supplements and set reminders to improve adherence. Check vitamin D  today.

## 2024-08-01 ENCOUNTER — Ambulatory Visit: Payer: Self-pay | Admitting: Nurse Practitioner

## 2024-08-01 LAB — CBC WITH DIFFERENTIAL/PLATELET
Basophils Absolute: 0.1 K/uL (ref 0.0–0.1)
Basophils Relative: 0.7 % (ref 0.0–3.0)
Eosinophils Absolute: 0 K/uL (ref 0.0–0.7)
Eosinophils Relative: 0.6 % (ref 0.0–5.0)
HCT: 36.3 % (ref 36.0–46.0)
Hemoglobin: 11.8 g/dL — ABNORMAL LOW (ref 12.0–15.0)
Lymphocytes Relative: 34.8 % (ref 12.0–46.0)
Lymphs Abs: 2.5 K/uL (ref 0.7–4.0)
MCHC: 32.5 g/dL (ref 30.0–36.0)
MCV: 85 fl (ref 78.0–100.0)
Monocytes Absolute: 0.4 K/uL (ref 0.1–1.0)
Monocytes Relative: 5.2 % (ref 3.0–12.0)
Neutro Abs: 4.2 K/uL (ref 1.4–7.7)
Neutrophils Relative %: 58.7 % (ref 43.0–77.0)
Platelets: 226 K/uL (ref 150.0–400.0)
RBC: 4.27 Mil/uL (ref 3.87–5.11)
RDW: 12.9 % (ref 11.5–15.5)
WBC: 7.1 K/uL (ref 4.0–10.5)

## 2024-08-01 LAB — COMPREHENSIVE METABOLIC PANEL WITH GFR
ALT: 15 U/L (ref 0–35)
AST: 18 U/L (ref 0–37)
Albumin: 4.3 g/dL (ref 3.5–5.2)
Alkaline Phosphatase: 69 U/L (ref 39–117)
BUN: 10 mg/dL (ref 6–23)
CO2: 25 meq/L (ref 19–32)
Calcium: 8.9 mg/dL (ref 8.4–10.5)
Chloride: 102 meq/L (ref 96–112)
Creatinine, Ser: 0.91 mg/dL (ref 0.40–1.20)
GFR: 85.58 mL/min (ref >60.00)
Glucose, Bld: 85 mg/dL (ref 70–99)
Potassium: 3.7 meq/L (ref 3.5–5.1)
Sodium: 137 meq/L (ref 135–145)
Total Bilirubin: 0.9 mg/dL (ref 0.2–1.2)
Total Protein: 7.6 g/dL (ref 6.0–8.3)

## 2024-08-01 LAB — TSH: TSH: 2.46 u[IU]/mL (ref 0.35–5.50)

## 2024-08-01 LAB — VITAMIN B12: Vitamin B-12: >1500 pg/mL — ABNORMAL HIGH (ref 211–911)

## 2024-08-01 LAB — VITAMIN D 25 HYDROXY (VIT D DEFICIENCY, FRACTURES): VITD: 12.95 ng/mL — ABNORMAL LOW (ref 30.00–100.00)

## 2024-08-01 MED ORDER — VITAMIN D (ERGOCALCIFEROL) 1.25 MG (50000 UNIT) PO CAPS: 50000.0000 [IU] | ORAL_CAPSULE | ORAL | 1 refills | Status: AC

## 2024-08-22 ENCOUNTER — Ambulatory Visit

## 2024-08-29 ENCOUNTER — Ambulatory Visit

## 2024-09-19 ENCOUNTER — Ambulatory Visit

## 2024-09-19 DIAGNOSIS — E538 Deficiency of other specified B group vitamins: Secondary | ICD-10-CM | POA: Diagnosis not present

## 2024-09-19 MED ORDER — CYANOCOBALAMIN 1000 MCG/ML IJ SOLN
1000.0000 ug | Freq: Once | INTRAMUSCULAR | Status: AC
  Administered 2024-09-19: 1000 ug via INTRAMUSCULAR

## 2024-09-19 NOTE — Progress Notes (Signed)
 Per orders of Tinnie Harada, NP, injection of B-12 given by Vita Cleveland, CMA in left deltoid.Patient tolerated injection well.

## 2024-09-20 ENCOUNTER — Other Ambulatory Visit: Payer: Self-pay | Admitting: Genetic Counselor

## 2024-09-20 DIAGNOSIS — Z006 Encounter for examination for normal comparison and control in clinical research program: Secondary | ICD-10-CM

## 2024-10-17 ENCOUNTER — Ambulatory Visit

## 2024-11-21 ENCOUNTER — Ambulatory Visit

## 2024-11-21 DIAGNOSIS — E538 Deficiency of other specified B group vitamins: Secondary | ICD-10-CM | POA: Diagnosis not present

## 2024-11-21 MED ORDER — CYANOCOBALAMIN 1000 MCG/ML IJ SOLN
1000.0000 ug | Freq: Once | INTRAMUSCULAR | Status: AC
  Administered 2024-11-21: 1000 ug via INTRAMUSCULAR

## 2024-11-21 NOTE — Progress Notes (Signed)
 Per orders of Lauren McElwee, injection of B12 given in RT deltoid by Karna Christ, cma.  Patient tolerated injection well.

## 2025-01-30 ENCOUNTER — Encounter: Admitting: Nurse Practitioner
